# Patient Record
Sex: Female | Born: 1940
Health system: Southern US, Community
[De-identification: ages and names within clinical notes are randomized; demographics above are authoritative.]

## PROBLEM LIST (undated history)

## (undated) DIAGNOSIS — E7800 Pure hypercholesterolemia, unspecified: Secondary | ICD-10-CM

## (undated) DIAGNOSIS — I2510 Atherosclerotic heart disease of native coronary artery without angina pectoris: Secondary | ICD-10-CM

## (undated) DIAGNOSIS — I10 Essential (primary) hypertension: Secondary | ICD-10-CM

## (undated) DIAGNOSIS — G43909 Migraine, unspecified, not intractable, without status migrainosus: Secondary | ICD-10-CM

## (undated) DIAGNOSIS — J189 Pneumonia, unspecified organism: Secondary | ICD-10-CM

## (undated) DIAGNOSIS — M1990 Unspecified osteoarthritis, unspecified site: Secondary | ICD-10-CM

## (undated) HISTORY — PX: TUBAL LIGATION: SHX77

## (undated) HISTORY — PX: TONSILLECTOMY: SUR1361

---

## 1998-10-26 ENCOUNTER — Other Ambulatory Visit: Admission: RE | Admit: 1998-10-26 | Discharge: 1998-10-26 | Payer: Self-pay | Admitting: Obstetrics and Gynecology

## 2000-01-09 ENCOUNTER — Other Ambulatory Visit: Admission: RE | Admit: 2000-01-09 | Discharge: 2000-01-09 | Payer: Self-pay | Admitting: Obstetrics and Gynecology

## 2000-03-11 ENCOUNTER — Other Ambulatory Visit: Admission: RE | Admit: 2000-03-11 | Discharge: 2000-03-11 | Payer: Self-pay | Admitting: Obstetrics and Gynecology

## 2001-03-20 ENCOUNTER — Other Ambulatory Visit: Admission: RE | Admit: 2001-03-20 | Discharge: 2001-03-20 | Payer: Self-pay | Admitting: Obstetrics and Gynecology

## 2002-03-26 ENCOUNTER — Other Ambulatory Visit: Admission: RE | Admit: 2002-03-26 | Discharge: 2002-03-26 | Payer: Self-pay | Admitting: Obstetrics and Gynecology

## 2003-05-19 ENCOUNTER — Other Ambulatory Visit: Admission: RE | Admit: 2003-05-19 | Discharge: 2003-05-19 | Payer: Self-pay | Admitting: Obstetrics and Gynecology

## 2004-10-12 ENCOUNTER — Other Ambulatory Visit: Admission: RE | Admit: 2004-10-12 | Discharge: 2004-10-12 | Payer: Self-pay | Admitting: Obstetrics and Gynecology

## 2005-10-31 ENCOUNTER — Other Ambulatory Visit: Admission: RE | Admit: 2005-10-31 | Discharge: 2005-10-31 | Payer: Self-pay | Admitting: Obstetrics and Gynecology

## 2006-04-11 ENCOUNTER — Ambulatory Visit (HOSPITAL_COMMUNITY): Admission: RE | Admit: 2006-04-11 | Discharge: 2006-04-11 | Payer: Self-pay | Admitting: Internal Medicine

## 2010-10-11 ENCOUNTER — Other Ambulatory Visit (HOSPITAL_COMMUNITY): Payer: Self-pay | Admitting: Obstetrics and Gynecology

## 2010-10-11 DIAGNOSIS — Z139 Encounter for screening, unspecified: Secondary | ICD-10-CM

## 2010-10-13 ENCOUNTER — Ambulatory Visit (HOSPITAL_COMMUNITY)
Admission: RE | Admit: 2010-10-13 | Discharge: 2010-10-13 | Disposition: A | Discharge status: Home / Self Care | Payer: Medicare Other | Source: Ambulatory Visit | Attending: Obstetrics and Gynecology | Admitting: Obstetrics and Gynecology

## 2010-10-13 DIAGNOSIS — Z139 Encounter for screening, unspecified: Secondary | ICD-10-CM

## 2010-10-17 ENCOUNTER — Ambulatory Visit (HOSPITAL_COMMUNITY)
Admission: RE | Admit: 2010-10-17 | Discharge: 2010-10-17 | Disposition: A | Discharge status: Home / Self Care | Payer: Medicare Other | Source: Ambulatory Visit | Attending: Obstetrics and Gynecology | Admitting: Obstetrics and Gynecology

## 2010-10-17 DIAGNOSIS — Z1231 Encounter for screening mammogram for malignant neoplasm of breast: Secondary | ICD-10-CM | POA: Insufficient documentation

## 2011-09-18 ENCOUNTER — Telehealth: Payer: Self-pay

## 2011-09-18 NOTE — Telephone Encounter (Signed)
LMOM for pt to call. 

## 2011-09-20 ENCOUNTER — Other Ambulatory Visit: Payer: Self-pay

## 2011-09-20 DIAGNOSIS — Z139 Encounter for screening, unspecified: Secondary | ICD-10-CM

## 2011-09-20 NOTE — Telephone Encounter (Signed)
OK as is.

## 2011-09-20 NOTE — Telephone Encounter (Signed)
Per Selena Batten, had to change appt to 12:50 PM.  Entered on pt's instructions.  Rx and instructions faxed to [facility] pharmacy.

## 2011-09-20 NOTE — Telephone Encounter (Signed)
Gastroenterology Pre-Procedure Form   Request Date: 09/19/2011     Requesting Physician: Dr. Ouida Sills     PATIENT INFORMATION:  Anna Hicks is a 71 y.o., female (DOB=02-Jun-1941).  PROCEDURE: Procedure(s) requested: colonoscopy Procedure Reason: screening for colon cancer  PATIENT REVIEW QUESTIONS: The patient reports the following:   1. Diabetes Melitis: no 2. Joint replacements in the past 12 months: no 3. Major health problems in the past 3 months: no 4. Has an artificial valve or MVP:no 5. Has been advised in past to take antibiotics in advance of a procedure like teeth cleaning: no}    MEDICATIONS & ALLERGIES:    Patient reports the following regarding taking any blood thinners:   Plavix? no Aspirin?no Coumadin?  no  Patient confirms/reports the following medications:  Current Outpatient Prescriptions  Medication Sig Dispense Refill  . estrogen, conjugated,-medroxyprogesterone (PREMPRO) 0.625-5 MG per tablet Take 1 tablet by mouth daily. Pt takes 1/2 tablet daily      . Omega-3 Fatty Acids (FISH OIL) 1200 MG CAPS Take by mouth.        Patient confirms/reports the following allergies:  Allergies  Allergen Reactions  . Penicillins Hives    Patient is appropriate to schedule for requested procedure(s): yes  AUTHORIZATION INFORMATION Primary Insurance:   ID #:   Group #:  Pre-Cert / Auth required:  Pre-Cert / Auth #:   Secondary Insurance:   ID #:   Group #: Pre-Cert / Auth required Pre-Cert / Auth #:   No orders of the defined types were placed in this encounter.    SCHEDULE INFORMATION: Procedure has been scheduled as follows:  Date:09/25/2011        Time: 12:15 PM                              Location: [facility] Short Stay  This Gastroenterology Pre-Precedure Form is being routed to the following provider(s) for review: R. Roetta Sessions, MD

## 2011-09-24 MED ORDER — SODIUM CHLORIDE 0.45 % IV SOLN
Freq: Once | INTRAVENOUS | Status: AC
  Administered 2011-09-25: 12:00:00 via INTRAVENOUS

## 2011-09-25 ENCOUNTER — Other Ambulatory Visit: Payer: Self-pay | Admitting: Internal Medicine

## 2011-09-25 ENCOUNTER — Encounter (HOSPITAL_COMMUNITY): Payer: Self-pay

## 2011-09-25 ENCOUNTER — Encounter (HOSPITAL_COMMUNITY)
Admission: RE | Disposition: A | Discharge status: Home / Self Care | Payer: Self-pay | Source: Ambulatory Visit | Attending: Internal Medicine

## 2011-09-25 ENCOUNTER — Ambulatory Visit (HOSPITAL_COMMUNITY)
Admission: RE | Admit: 2011-09-25 | Discharge: 2011-09-25 | Disposition: A | Discharge status: Home / Self Care | Payer: Medicare Other | Source: Ambulatory Visit | Attending: Internal Medicine | Admitting: Internal Medicine

## 2011-09-25 DIAGNOSIS — D128 Benign neoplasm of rectum: Secondary | ICD-10-CM | POA: Insufficient documentation

## 2011-09-25 DIAGNOSIS — D129 Benign neoplasm of anus and anal canal: Secondary | ICD-10-CM | POA: Insufficient documentation

## 2011-09-25 DIAGNOSIS — Z139 Encounter for screening, unspecified: Secondary | ICD-10-CM

## 2011-09-25 DIAGNOSIS — K620 Anal polyp: Secondary | ICD-10-CM

## 2011-09-25 DIAGNOSIS — K5730 Diverticulosis of large intestine without perforation or abscess without bleeding: Secondary | ICD-10-CM | POA: Insufficient documentation

## 2011-09-25 DIAGNOSIS — Z1211 Encounter for screening for malignant neoplasm of colon: Secondary | ICD-10-CM | POA: Insufficient documentation

## 2011-09-25 DIAGNOSIS — K621 Rectal polyp: Secondary | ICD-10-CM

## 2011-09-25 HISTORY — PX: COLONOSCOPY: SHX5424

## 2011-09-25 HISTORY — DX: Unspecified osteoarthritis, unspecified site: M19.90

## 2011-09-25 SURGERY — COLONOSCOPY
Anesthesia: Moderate Sedation

## 2011-09-25 MED ORDER — MIDAZOLAM HCL 5 MG/5ML IJ SOLN
INTRAMUSCULAR | Status: AC
  Filled 2011-09-25: qty 10

## 2011-09-25 MED ORDER — MEPERIDINE HCL 100 MG/ML IJ SOLN
INTRAMUSCULAR | Status: AC
  Filled 2011-09-25: qty 2

## 2011-09-25 MED ORDER — MEPERIDINE HCL 100 MG/ML IJ SOLN
INTRAMUSCULAR | Status: DC | PRN
  Administered 2011-09-25: 50 mg via INTRAVENOUS
  Administered 2011-09-25: 25 mg via INTRAVENOUS

## 2011-09-25 MED ORDER — MIDAZOLAM HCL 5 MG/5ML IJ SOLN
INTRAMUSCULAR | Status: DC | PRN
  Administered 2011-09-25: 1 mg via INTRAVENOUS
  Administered 2011-09-25: 2 mg via INTRAVENOUS
  Administered 2011-09-25: 1 mg via INTRAVENOUS

## 2011-09-25 MED ORDER — SIMETHICONE 40 MG/0.6ML PO SUSP
ORAL | Status: DC | PRN
  Administered 2011-09-25: 13:00:00

## 2011-09-25 NOTE — H&P (Addendum)
  Primary Care Physician:  Carylon Perches, MD, MD Primary Gastroenterologist:  Dr.   Pre-Procedure History & Physical: HPI:  Anna Hicks is a 72 y.o. female is here for a screening colonoscopy. No prior colonoscopy. No bowel symptoms. No family history of colon polyps or colon cancer except in possibly a distant uncle and advanced age.  Past Medical History  Diagnosis Date  . Arthritis     History reviewed. No pertinent past surgical history.  Prior to Admission medications   Medication Sig Start Date End Date Taking? Authorizing Provider  estrogen, conjugated,-medroxyprogesterone (PREMPRO) 0.625-5 MG per tablet Take 1 tablet by mouth daily. Pt takes 1/2 tablet daily   Yes Historical Provider, MD  Omega-3 Fatty Acids (FISH OIL) 1200 MG CAPS Take by mouth.   Yes Historical Provider, MD    Allergies as of 09/20/2011 - Review Complete 09/20/2011  Allergen Reaction Noted  . Penicillins Hives 09/20/2011    Family History  Problem Relation Age of Onset  . Colon cancer Maternal Uncle     History   Social History  . Marital Status: Married    Spouse Name: N/A    Number of Children: N/A  . Years of Education: N/A   Occupational History  . Not on file.   Social History Main Topics  . Smoking status: Never Smoker   . Smokeless tobacco: Not on file  . Alcohol Use: No  . Drug Use: No  . Sexually Active:    Other Topics Concern  . Not on file   Social History Narrative  . No narrative on file    Review of Systems: See HPI, otherwise negative ROS  Physical Exam: BP 160/88  Pulse 70  Temp(Src) 98.4 F (36.9 C) (Oral)  Resp 22  Ht 5\' 2"  (1.575 m)  Wt 125 lb (56.7 kg)  BMI 22.86 kg/m2  SpO2 99% General:   Alert,  Well-developed, well-nourished, pleasant and cooperative in NAD Head:  Normocephalic and atraumatic. Eyes:  Sclera clear, no icterus.   Conjunctiva pink. Ears:  Normal auditory acuity. Nose:  No deformity, discharge,  or lesions. Mouth:  No deformity or  lesions, dentition normal. Neck:  Supple; no masses or thyromegaly. Lungs:  Clear throughout to auscultation.   No wheezes, crackles, or rhonchi. No acute distress. Heart:  Regular rate and rhythm; no murmurs, clicks, rubs,  or gallops. Abdomen: Flat. Positive bowel sounds soft nontender without appreciable mass organomegaly Msk:  Symmetrical without gross deformities. Normal posture. Pulses:  Normal pulses noted. Extremities:  Without clubbing or edema. Neurologic:  Alert and  oriented x4;  grossly normal neurologically. Skin:  Intact without significant lesions or rashes. Cervical Nodes:  No significant cervical adenopathy. Psych:  Alert and cooperative. Normal mood and affect.  Impression/Plan: Anna Hicks is now here to undergo a screening colonoscopy.  First ever average risk screening examination.  Risks, benefits, limitations, imponderables and alternatives regarding colonoscopy have been reviewed with the patient. Questions have been answered. All parties agreeable.

## 2011-09-25 NOTE — Op Note (Signed)
[facility] [address] [place], [place]  [address]  COLONOSCOPY PROCEDURE REPORT  PATIENT:  Anna Hicks, Anna Hicks  MR#:  [id number] BIRTHDATE:  1941/03/28, 70 yrs. old  GENDER:  female ENDOSCOPIST:  R. Roetta Sessions, MD FACP [facility] REF. BY:          Dr. Carylon Perches PROCEDURE DATE:  09/25/2011 PROCEDURE:  Colonoscopy with biopsy  INDICATIONS:  First ever average risk screening colonoscopy  INFORMED CONSENT:  The risks, benefits, alternatives and imponderables including but not limited to bleeding, perforation as well as the possibility of a missed lesion have been reviewed. The potential for biopsy, lesion removal, etc. have also been discussed.  Questions have been answered.  All parties agreeable. Please see the history and physical in the medical record for more information.  MEDICATIONS:  Versed 4 mg and Demerol 75 mg IV in divided doses  DESCRIPTION OF PROCEDURE:  After a digital rectal exam was performed, the EC-[id number] ([id number]) colonoscope was advanced from the anus through the rectum and colon to the area of the cecum, ileocecal valve and appendiceal orifice.  The cecum was deeply intubated.  These structures were well-seen and photographed for the record.  From the level of the cecum and ileocecal valve, the scope was slowly and cautiously withdrawn.  The mucosal surfaces were carefully surveyed utilizing scope tip deflection to facilitate fold flattening as needed.  The scope was pulled down into the rectum where a thorough examination including retroflexion was performed. <<PROCEDUREIMAGES>>  FINDINGS:   Excellent preparation. 4 diminutive polyps in at 5 cm from the anal verge; otherwise normal rectum. Left-sided diverticula; remainder of colonic mucosa appeared normal  THERAPEUTIC / DIAGNOSTIC MANEUVERS PERFORMED:   4 diminutive polyps in the rectum removed with cold biopsy technique  COMPLICATIONS:  none  CECAL WITHDRAWAL TIME:   11 minutes  IMPRESSION:      Rectal polyps-removed as described above; left-sided colonic diverticulosis  RECOMMENDATIONS:    Followup pathology  ______________________________ R. Roetta Sessions, MD Caleen Essex  CC:  n. eSIGNED:   R. Roetta Sessions at 09/25/2011 01:45 PM  Lyndel Pleasure, [phone]

## 2011-09-29 ENCOUNTER — Encounter: Payer: Self-pay | Admitting: Internal Medicine

## 2011-10-03 ENCOUNTER — Encounter (HOSPITAL_COMMUNITY): Payer: Self-pay | Admitting: Internal Medicine

## 2012-04-28 ENCOUNTER — Encounter: Payer: Self-pay | Admitting: Obstetrics and Gynecology

## 2012-04-28 ENCOUNTER — Ambulatory Visit (INDEPENDENT_AMBULATORY_CARE_PROVIDER_SITE_OTHER): Payer: Medicare Other | Admitting: Obstetrics and Gynecology

## 2012-04-28 VITALS — BP 130/88 | HR 64 | Ht 60.5 in | Wt 132.0 lb

## 2012-04-28 DIAGNOSIS — Z01419 Encounter for gynecological examination (general) (routine) without abnormal findings: Secondary | ICD-10-CM

## 2012-04-28 DIAGNOSIS — Z124 Encounter for screening for malignant neoplasm of cervix: Secondary | ICD-10-CM

## 2012-04-28 MED ORDER — CONJ ESTROG-MEDROXYPROGEST ACE 0.625-5 MG PO TABS: 1.0000 | ORAL_TABLET | Freq: Every day | ORAL | Status: DC

## 2012-04-28 NOTE — Progress Notes (Signed)
The patient is taking hormone replacement therapy x 16 years The patient  is not taking a Calcium supplement. Post-menopausal bleeding:no  Last Pap: was normal February  2012 Last mammogram: was normal February  2012 Last DEXA scan : T= -1.09 October 2009 Last colonoscopy:abnormal  3 polyps April 2013  Urinary symptoms: none Normal bowel movements: Yes Reports abuse at home: No:   Subjective:    Anna Hicks is a 71 y.o. female G2P2 who presents for annual exam.  The patient has no complaints today.   The following portions of the patient's history were reviewed and updated as appropriate: allergies, current medications, past family history, past medical history, past social history, past surgical history and problem list.  Review of Systems Pertinent items are noted in HPI. Gastrointestinal:No change in bowel habits, no abdominal pain, no rectal bleeding Genitourinary:negative for dysuria, frequency, hematuria, nocturia and urinary incontinence    Objective:     BP 130/88  Pulse 64  Ht 5' 0.5" (1.537 m)  Wt 132 lb (59.875 kg)  BMI 25.36 kg/m2  Weight:  Wt Readings from Last 1 Encounters:  04/28/12 132 lb (59.875 kg)     BMI: Body mass index is 25.36 kg/(m^2). General Appearance: Alert, appropriate appearance for age. No acute distress HEENT: Grossly normal Neck / Thyroid: Supple, no masses, nodes or enlargement Lungs: clear to auscultation bilaterally Back: No CVA tenderness Breast Exam: No dimpling, nipple retraction or discharge. No masses or nodes. and No masses or nodes.No dimpling, nipple retraction or discharge. Cardiovascular: Regular rate and rhythm. S1, S2, no murmur Gastrointestinal: Soft, non-tender, no masses or organomegaly.colonoscopy done.  Polyps removed. Pelvic Exam: Vulva and vagina appear normal. Bimanual exam reveals normal uterus and adnexa. Rectovaginal: normal rectal, no masses Lymphatic Exam: Non-palpable nodes in neck, clavicular, axillary,  or inguinal regions Skin: no rash or abnormalities Neurologic: Normal gait and speech, no tremor  Psychiatric: Alert and oriented, appropriate affect.    Urinalysis:Not done    Assessment:    Normal gyn exam    Plan:   Mammogram yearly recommended: pt to schedule in [place] pap smear return annually or prn Follow-up:  for annual exam Discussed BRCA-1 counseling due to daughter being carrier with b/l mastectomy. Pt may make appointment at a later date. HRT reviewed with [facility], [facility]: pt desires to continue

## 2012-04-29 LAB — PAP IG W/ RFLX HPV ASCU

## 2012-04-30 ENCOUNTER — Telehealth: Payer: Self-pay

## 2012-04-30 NOTE — Telephone Encounter (Signed)
[facility] pharmacy calling for confirmation of Prempro.  Pt has been on 0.625/2.5 - take one tab daily.  rx needs to stay the same in order for it not to be recoded and insurance possible not cover.  rx  Changed back to 0.625/2.5 for these purposes.  Spoke with Elita Quick at [facility] [facility].  ld

## 2012-05-01 ENCOUNTER — Ambulatory Visit: Payer: Self-pay | Admitting: Obstetrics and Gynecology

## 2013-09-21 ENCOUNTER — Other Ambulatory Visit (HOSPITAL_COMMUNITY): Payer: Self-pay | Admitting: Internal Medicine

## 2013-09-21 DIAGNOSIS — Z139 Encounter for screening, unspecified: Secondary | ICD-10-CM

## 2013-10-09 ENCOUNTER — Ambulatory Visit (HOSPITAL_COMMUNITY)
Admission: RE | Admit: 2013-10-09 | Discharge: 2013-10-09 | Disposition: A | Discharge status: Home / Self Care | Payer: Medicare Other | Source: Ambulatory Visit | Attending: Internal Medicine | Admitting: Internal Medicine

## 2013-10-09 ENCOUNTER — Other Ambulatory Visit (HOSPITAL_COMMUNITY): Payer: Self-pay | Admitting: Internal Medicine

## 2013-10-09 DIAGNOSIS — Z1231 Encounter for screening mammogram for malignant neoplasm of breast: Secondary | ICD-10-CM | POA: Insufficient documentation

## 2013-10-09 DIAGNOSIS — Z139 Encounter for screening, unspecified: Secondary | ICD-10-CM

## 2013-10-09 DIAGNOSIS — Z1239 Encounter for other screening for malignant neoplasm of breast: Secondary | ICD-10-CM

## 2014-07-05 ENCOUNTER — Encounter: Payer: Self-pay | Admitting: Obstetrics and Gynecology

## 2014-10-12 ENCOUNTER — Encounter (HOSPITAL_COMMUNITY): Payer: Self-pay

## 2014-10-12 ENCOUNTER — Ambulatory Visit (HOSPITAL_COMMUNITY)
Admission: RE | Admit: 2014-10-12 | Discharge: 2014-10-12 | Disposition: A | Discharge status: Home / Self Care | Payer: Medicare Other | Source: Ambulatory Visit | Attending: Internal Medicine | Admitting: Internal Medicine

## 2014-10-12 ENCOUNTER — Encounter (HOSPITAL_COMMUNITY)
Admission: RE | Admit: 2014-10-12 | Discharge: 2014-10-12 | Disposition: A | Discharge status: Home / Self Care | Payer: Medicare Other | Source: Ambulatory Visit | Attending: Internal Medicine | Admitting: Internal Medicine

## 2014-10-12 DIAGNOSIS — R0602 Shortness of breath: Secondary | ICD-10-CM

## 2014-10-12 DIAGNOSIS — R55 Syncope and collapse: Secondary | ICD-10-CM | POA: Diagnosis not present

## 2014-10-12 DIAGNOSIS — R0600 Dyspnea, unspecified: Secondary | ICD-10-CM

## 2014-10-12 MED ORDER — SODIUM CHLORIDE 0.9 % IJ SOLN
INTRAMUSCULAR | Status: AC
  Filled 2014-10-12: qty 3

## 2014-10-12 MED ORDER — TECHNETIUM TC 99M SESTAMIBI GENERIC - CARDIOLITE
30.0000 | Freq: Once | INTRAVENOUS | Status: AC | PRN
  Administered 2014-10-12: 30 via INTRAVENOUS

## 2014-10-12 MED ORDER — SODIUM CHLORIDE 0.9 % IJ SOLN
10.0000 mL | INTRAMUSCULAR | Status: DC | PRN
  Administered 2014-10-12: 10 mL via INTRAVENOUS
  Filled 2014-10-12: qty 10

## 2014-10-12 NOTE — Progress Notes (Signed)
Stress Lab Nurses Notes - [place] [facility] [place] 10/12/2014 Reason for doing test: Dyspnea and Syncope Type of test: Stress Cardiolite / 2 Day Study Nurse performing test: Gerrit Halls, RN Nuclear Medicine Tech: Melburn Hake Echo Tech: Not Applicable MD performing test: R. Willey Blade Family MD: Willey Blade Test explained and consent signed: Yes.   IV started: Saline lock flushed and No redness or edema Symptoms:SOB  Treatment/Intervention: None Reason test stopped: fatigue and reached target HR After recovery IV was: Discontinued via X-ray tech and No redness or edema Patient to return to [facility] Med at : 8:15 Patient discharged: Home Patient's Condition upon discharge was: stable Comments: During test peak BP 215/117 & HR 166.  Recovery BP 180/91 & HR 83.  Symptoms resolved in recovery. Geanie Cooley T

## 2014-10-13 ENCOUNTER — Encounter (HOSPITAL_COMMUNITY): Payer: Self-pay

## 2014-10-13 ENCOUNTER — Encounter (HOSPITAL_COMMUNITY)
Admission: RE | Admit: 2014-10-13 | Discharge: 2014-10-13 | Disposition: A | Discharge status: Home / Self Care | Payer: Medicare Other | Source: Ambulatory Visit | Attending: Internal Medicine | Admitting: Internal Medicine

## 2014-10-13 DIAGNOSIS — R55 Syncope and collapse: Secondary | ICD-10-CM | POA: Diagnosis not present

## 2014-10-13 MED ORDER — TECHNETIUM TC 99M SESTAMIBI - CARDIOLITE
25.0000 | Freq: Once | INTRAVENOUS | Status: AC | PRN
  Administered 2014-10-13: 07:00:00 23 via INTRAVENOUS

## 2014-10-15 ENCOUNTER — Ambulatory Visit (INDEPENDENT_AMBULATORY_CARE_PROVIDER_SITE_OTHER): Payer: Medicare Other | Admitting: Cardiovascular Disease

## 2014-10-15 ENCOUNTER — Other Ambulatory Visit: Payer: Self-pay | Admitting: Cardiovascular Disease

## 2014-10-15 ENCOUNTER — Ambulatory Visit (HOSPITAL_COMMUNITY)
Admission: RE | Admit: 2014-10-15 | Discharge: 2014-10-15 | Disposition: A | Discharge status: Home / Self Care | Payer: Medicare Other | Source: Ambulatory Visit | Attending: Cardiovascular Disease | Admitting: Cardiovascular Disease

## 2014-10-15 ENCOUNTER — Encounter: Payer: Self-pay | Admitting: Cardiovascular Disease

## 2014-10-15 VITALS — BP 159/85 | HR 65 | Ht 61.0 in | Wt 135.0 lb

## 2014-10-15 DIAGNOSIS — R0609 Other forms of dyspnea: Secondary | ICD-10-CM

## 2014-10-15 DIAGNOSIS — E785 Hyperlipidemia, unspecified: Secondary | ICD-10-CM

## 2014-10-15 DIAGNOSIS — R931 Abnormal findings on diagnostic imaging of heart and coronary circulation: Secondary | ICD-10-CM

## 2014-10-15 DIAGNOSIS — R0602 Shortness of breath: Secondary | ICD-10-CM | POA: Diagnosis not present

## 2014-10-15 DIAGNOSIS — R9439 Abnormal result of other cardiovascular function study: Secondary | ICD-10-CM

## 2014-10-15 DIAGNOSIS — Z01811 Encounter for preprocedural respiratory examination: Secondary | ICD-10-CM

## 2014-10-15 DIAGNOSIS — Z136 Encounter for screening for cardiovascular disorders: Secondary | ICD-10-CM

## 2014-10-15 DIAGNOSIS — R55 Syncope and collapse: Secondary | ICD-10-CM

## 2014-10-15 DIAGNOSIS — Z789 Other specified health status: Secondary | ICD-10-CM

## 2014-10-15 DIAGNOSIS — I10 Essential (primary) hypertension: Secondary | ICD-10-CM

## 2014-10-15 DIAGNOSIS — Z889 Allergy status to unspecified drugs, medicaments and biological substances status: Secondary | ICD-10-CM

## 2014-10-15 MED ORDER — EZETIMIBE 10 MG PO TABS: 10.0000 mg | ORAL_TABLET | Freq: Every day | ORAL | Status: AC

## 2014-10-15 MED ORDER — LOSARTAN POTASSIUM 25 MG PO TABS: 25.0000 mg | ORAL_TABLET | Freq: Every day | ORAL | Status: DC

## 2014-10-15 NOTE — Patient Instructions (Addendum)
Your physician recommends that you schedule a follow-up appointment in: 2 weeks with Dr Bronson Ing    START Losartan 25 mg daily for your blood pressure  Your physician has requested that you have an echocardiogram. Echocardiography is a painless test that uses sound waves to create images of your heart. It provides your doctor with information about the size and shape of your heart and how well your heart's chambers and valves are working. This procedure takes approximately one hour. There are no restrictions for this procedure.  PLEASE GET CHEST X-RAY TODAY  Your physician has requested that you have a cardiac catheterization on 10/20/14 at [facility] with Dr.McAlhany,arrive at [place]. Cardiac catheterization is used to diagnose and/or treat various heart conditions. Doctors may recommend this procedure for a number of different reasons. The most common reason is to evaluate chest pain. Chest pain can be a symptom of coronary artery disease (CAD), and cardiac catheterization can show whether plaque is narrowing or blocking your heart's arteries. This procedure is also used to evaluate the valves, as well as measure the blood flow and oxygen levels in different parts of your heart. For further information please visit [email or web address]. Please follow instruction sheet, as given.     Thank you for choosing [facility] !         Addendum:Add Zetia 10 mg daily,mailed lab slip for lipid recheck in 3 months,pt aware

## 2014-10-15 NOTE — Progress Notes (Addendum)
Patient ID: TAMERA PINGLEY, female   DOB: 03-21-1941, 74 y.o.   MRN: [id number]       CARDIOLOGY CONSULT NOTE  Patient ID: MARAYA GWILLIAM MRN: [id number] DOB/AGE: 10/09/1940 74 y.o.  Admit date: (Not on file) Primary Physician Asencion Noble, MD  Reason for Consultation: abnormal stress test  HPI: The patient is a 74 year old woman with a history of hyperlipidemia intolerant to statin therapy. She had been experiencing shortness of breath and saw her primary care physician, Dr. Willey Blade. He ordered a nuclear stress test which demonstrated a reversible anterior wall defect suggestive of ischemia, calculated LVEF 79%. Normal regional wall motion was demonstrated. ECG today shows normal sinus rhythm, heart rate 61 bpm, with no ischemic ST segment or T-wave abnormalities. She is here with her son, Jenny Reichmann, who teaches physical education and health at [employer]. She has been experiencing exertional dyspnea for the past 6 months. 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 She denies chest pain, orthopnea, paroxysmal nocturnal dyspnea, and leg swelling. She had been snow skiing approximately 2 weeks ago and bent over and stood up and passed out for roughly 4 minutes. She also noticed that her hands turned purple the other day while shopping. She denies pain in her extremities. She has a wrist automated blood pressure cuff at home and checks it regularly, and systolic readings have consistently been in the 140-150 range.    Allergies  Allergen Reactions  . Penicillins Hives    Current Outpatient Prescriptions  Medication Sig Dispense Refill  . aspirin 81 MG tablet Take 81 mg by mouth daily.    Marland Kitchen estrogen, conjugated,-medroxyprogesterone (PREMPRO) 0.625-5 MG per tablet Take 1 tablet by mouth daily. Pt takes 1/2 tablet daily 30 tablet 11   No current facility-administered medications for this visit.    Past Medical History  Diagnosis  Date  . Arthritis     Past Surgical History  Procedure Laterality Date  . Colonoscopy  09/25/2011    Procedure: COLONOSCOPY;  Surgeon: Daneil Dolin, MD;  Location: [facility];  Service: Endoscopy;  Laterality: N/A;  12:15 pm    History   Social History  . Marital Status: Married    Spouse Name: N/A  . Number of Children: N/A  . Years of Education: N/A   Occupational History  . Not on file.   Social History Main Topics  . Smoking status: Never Smoker   . Smokeless tobacco: Never Used  . Alcohol Use: No  . Drug Use: No  . Sexual Activity:    Partners: Male    Birth Control/ Protection: Post-menopausal   Other Topics Concern  . Not on file   Social History Narrative     No family history of premature CAD in 1st degree relatives.  Prior to Admission medications   Medication Sig Start Date End Date Taking? Authorizing Provider  aspirin 81 MG tablet Take 81 mg by mouth daily.   Yes Historical Provider, MD  estrogen, conjugated,-medroxyprogesterone (PREMPRO) 0.625-5 MG per tablet Take 1 tablet by mouth daily. Pt takes 1/2 tablet daily 04/28/12  Yes Delsa Bern, MD     Review of systems complete and found to be negative unless listed above in HPI     Physical exam Blood pressure 159/85, pulse 65, height 5\' 1"  (1.549 m), weight 135 lb (61.236 kg), SpO2 96 %. General: NAD Neck: No JVD, no thyromegaly or thyroid nodule.  Lungs:  Clear to auscultation bilaterally with normal respiratory effort. CV: Nondisplaced PMI. Regular rate and rhythm, normal S1/S2, no S3/S4, no murmur.  No peripheral edema.  No carotid bruit.  Normal pedal pulses.  Abdomen: Soft, nontender, no hepatosplenomegaly, no distention.  Skin: Intact without lesions or rashes.  Neurologic: Alert and oriented x 3.  Psych: Normal affect. Extremities: No clubbing or cyanosis.  HEENT: Normal.   ECG: Most recent ECG reviewed.  Labs:  No results found for: WBC, HGB, HCT, MCV, PLT No results for  input(s): NA, K, CL, CO2, BUN, CREATININE, CALCIUM, PROT, BILITOT, ALKPHOS, ALT, AST, GLUCOSE in the last 168 hours.  Invalid input(s): LABALBU No results found for: CKTOTAL, CKMB, CKMBINDEX, TROPONINI No results found for: CHOL No results found for: HDL No results found for: LDLCALC No results found for: TRIG No results found for: CHOLHDL No results found for: LDLDIRECT       Studies: No results found.  ASSESSMENT AND PLAN:  1. Exertional dyspnea and syncope with abnormal nuclear stress test: There was no mention of anterior wall soft tissue attenuation artifact although regional wall motion was noted to be normal with normal LV systolic function. Her only complaint is shortness of breath. It appears she has untreated essential hypertension. I will obtain an echocardiogram to evaluate left ventricular systolic and diastolic function. In order to achieve diagnostic certainty, I will arrange for coronary angiography.  2. Essential hypertension: It appears she has had consistent systolic readings ranging from 140-150. I will start losartan 25 mg daily.  I will obtain an echocardiogram to evaluate left ventricular systolic and diastolic function and to evaluate for LVH and chamber size to assess chronicity of hypertension.  3. Hyperlipidemia: Appears to be intolerant to multiple statins. I will obtain a copy of lipids from PCP's office. I would consider initiating Zetia.  Dispo: f/u after cath.  Signed: Kate Sable, M.D., F.A.C.C.  10/15/2014, 9:41 AM  Labs reviewed. Normal renal function. Markedly elevated lipids (total cholesterol, triglycerides, and LDL). Will start Zetia 10 mg daily.

## 2014-10-15 NOTE — Addendum Note (Signed)
Addended by: Barbarann Ehlers A on: 10/15/2014 12:34 PM   Modules accepted: Orders

## 2014-10-18 ENCOUNTER — Other Ambulatory Visit (HOSPITAL_COMMUNITY): Payer: Self-pay | Admitting: Cardiovascular Disease

## 2014-10-18 ENCOUNTER — Ambulatory Visit (HOSPITAL_COMMUNITY)
Admission: RE | Admit: 2014-10-18 | Discharge: 2014-10-18 | Disposition: A | Discharge status: Home / Self Care | Payer: Medicare Other | Source: Ambulatory Visit | Attending: Cardiovascular Disease | Admitting: Cardiovascular Disease

## 2014-10-18 ENCOUNTER — Other Ambulatory Visit: Payer: Self-pay

## 2014-10-18 DIAGNOSIS — R0600 Dyspnea, unspecified: Secondary | ICD-10-CM | POA: Insufficient documentation

## 2014-10-18 DIAGNOSIS — I10 Essential (primary) hypertension: Secondary | ICD-10-CM

## 2014-10-18 DIAGNOSIS — R0602 Shortness of breath: Secondary | ICD-10-CM

## 2014-10-18 DIAGNOSIS — E785 Hyperlipidemia, unspecified: Secondary | ICD-10-CM | POA: Insufficient documentation

## 2014-10-18 NOTE — Progress Notes (Signed)
*  PRELIMINARY RESULTS* Echocardiogram 2D Echocardiogram has been performed.  Leavy Cella 10/18/2014, 12:17 PM

## 2014-10-19 ENCOUNTER — Encounter: Payer: Self-pay | Admitting: Cardiovascular Disease

## 2014-10-20 ENCOUNTER — Ambulatory Visit (HOSPITAL_COMMUNITY)
Admission: RE | Admit: 2014-10-20 | Discharge: 2014-10-21 | Disposition: A | Discharge status: Home / Self Care | Payer: Medicare Other | Source: Ambulatory Visit | Attending: Cardiovascular Disease | Admitting: Cardiovascular Disease

## 2014-10-20 ENCOUNTER — Encounter (HOSPITAL_COMMUNITY)
Admission: RE | Disposition: A | Discharge status: Home / Self Care | Payer: Medicare Other | Source: Ambulatory Visit | Attending: Cardiovascular Disease

## 2014-10-20 ENCOUNTER — Ambulatory Visit (HOSPITAL_COMMUNITY): Payer: Medicare Other

## 2014-10-20 ENCOUNTER — Encounter (HOSPITAL_COMMUNITY): Payer: Self-pay | Admitting: General Practice

## 2014-10-20 DIAGNOSIS — I10 Essential (primary) hypertension: Secondary | ICD-10-CM | POA: Insufficient documentation

## 2014-10-20 DIAGNOSIS — E785 Hyperlipidemia, unspecified: Secondary | ICD-10-CM | POA: Insufficient documentation

## 2014-10-20 DIAGNOSIS — I2510 Atherosclerotic heart disease of native coronary artery without angina pectoris: Secondary | ICD-10-CM | POA: Insufficient documentation

## 2014-10-20 DIAGNOSIS — R4701 Aphasia: Secondary | ICD-10-CM | POA: Diagnosis not present

## 2014-10-20 DIAGNOSIS — R931 Abnormal findings on diagnostic imaging of heart and coronary circulation: Secondary | ICD-10-CM

## 2014-10-20 DIAGNOSIS — R0600 Dyspnea, unspecified: Secondary | ICD-10-CM | POA: Diagnosis not present

## 2014-10-20 DIAGNOSIS — Z7982 Long term (current) use of aspirin: Secondary | ICD-10-CM | POA: Insufficient documentation

## 2014-10-20 DIAGNOSIS — I639 Cerebral infarction, unspecified: Secondary | ICD-10-CM | POA: Insufficient documentation

## 2014-10-20 DIAGNOSIS — Z880 Allergy status to penicillin: Secondary | ICD-10-CM | POA: Diagnosis not present

## 2014-10-20 DIAGNOSIS — R0609 Other forms of dyspnea: Secondary | ICD-10-CM

## 2014-10-20 DIAGNOSIS — R55 Syncope and collapse: Secondary | ICD-10-CM

## 2014-10-20 DIAGNOSIS — I2583 Coronary atherosclerosis due to lipid rich plaque: Secondary | ICD-10-CM

## 2014-10-20 DIAGNOSIS — R9430 Abnormal result of cardiovascular function study, unspecified: Secondary | ICD-10-CM | POA: Diagnosis present

## 2014-10-20 DIAGNOSIS — I471 Supraventricular tachycardia: Secondary | ICD-10-CM | POA: Diagnosis not present

## 2014-10-20 DIAGNOSIS — I6330 Cerebral infarction due to thrombosis of unspecified cerebral artery: Secondary | ICD-10-CM

## 2014-10-20 HISTORY — DX: Migraine, unspecified, not intractable, without status migrainosus: G43.909

## 2014-10-20 HISTORY — DX: Pneumonia, unspecified organism: J18.9

## 2014-10-20 HISTORY — DX: Essential (primary) hypertension: I10

## 2014-10-20 HISTORY — DX: Pure hypercholesterolemia, unspecified: E78.00

## 2014-10-20 HISTORY — PX: LEFT HEART CATHETERIZATION WITH CORONARY ANGIOGRAM: SHX5451

## 2014-10-20 HISTORY — PX: CARDIAC CATHETERIZATION: SHX172

## 2014-10-20 HISTORY — DX: Atherosclerotic heart disease of native coronary artery without angina pectoris: I25.10

## 2014-10-20 LAB — CBC
HCT: 42.7 % (ref 36.0–46.0)
Hemoglobin: 14.5 g/dL (ref 12.0–15.0)
MCH: 29.2 pg (ref 26.0–34.0)
MCHC: 34 g/dL (ref 30.0–36.0)
MCV: 85.9 fL (ref 78.0–100.0)
Platelets: 246 10*3/uL (ref 150–400)
RBC: 4.97 MIL/uL (ref 3.87–5.11)
RDW: 13.1 % (ref 11.5–15.5)
WBC: 5.7 10*3/uL (ref 4.0–10.5)

## 2014-10-20 LAB — BASIC METABOLIC PANEL
Anion gap: 5 (ref 5–15)
BUN: 18 mg/dL (ref 6–23)
CALCIUM: 9.3 mg/dL (ref 8.4–10.5)
CHLORIDE: 105 mmol/L (ref 96–112)
CO2: 30 mmol/L (ref 19–32)
Creatinine, Ser: 1.08 mg/dL (ref 0.50–1.10)
GFR calc Af Amer: 58 mL/min — ABNORMAL LOW (ref >90)
GFR, EST NON AFRICAN AMERICAN: 50 mL/min — AB (ref >90)
Glucose, Bld: 99 mg/dL (ref 70–99)
Potassium: 4 mmol/L (ref 3.5–5.1)
Sodium: 140 mmol/L (ref 135–145)

## 2014-10-20 LAB — CK TOTAL AND CKMB (NOT AT ARMC)
CK, MB: 12.5 ng/mL (ref 0.3–4.0)
Relative Index: 11.4 — ABNORMAL HIGH (ref 0.0–2.5)
Total CK: 110 U/L (ref 7–177)

## 2014-10-20 LAB — PROTIME-INR
INR: 0.95 (ref 0.00–1.49)
Prothrombin Time: 12.8 seconds (ref 11.6–15.2)

## 2014-10-20 LAB — GLUCOSE, CAPILLARY: GLUCOSE-CAPILLARY: 111 mg/dL — AB (ref 70–99)

## 2014-10-20 LAB — POCT ACTIVATED CLOTTING TIME: Activated Clotting Time: 374 seconds

## 2014-10-20 SURGERY — LEFT HEART CATHETERIZATION WITH CORONARY ANGIOGRAM
Anesthesia: LOCAL

## 2014-10-20 MED ORDER — HYDRALAZINE HCL 20 MG/ML IJ SOLN
10.0000 mg | Freq: Once | INTRAMUSCULAR | Status: AC
  Administered 2014-10-20: 10 mg via INTRAVENOUS

## 2014-10-20 MED ORDER — HEPARIN (PORCINE) IN NACL 2-0.9 UNIT/ML-% IJ SOLN
INTRAMUSCULAR | Status: AC
  Filled 2014-10-20: qty 1000

## 2014-10-20 MED ORDER — LORAZEPAM 2 MG/ML IJ SOLN
INTRAMUSCULAR | Status: AC
  Filled 2014-10-20: qty 1

## 2014-10-20 MED ORDER — HYDRALAZINE HCL 20 MG/ML IJ SOLN
INTRAMUSCULAR | Status: AC
  Filled 2014-10-20: qty 1

## 2014-10-20 MED ORDER — SODIUM CHLORIDE 0.9 % IJ SOLN: 3.0000 mL | Freq: Two times a day (BID) | INTRAMUSCULAR | Status: DC

## 2014-10-20 MED ORDER — ADENOSINE 12 MG/4ML IV SOLN
12.0000 mL | Freq: Once | INTRAVENOUS | Status: DC
  Filled 2014-10-20: qty 12

## 2014-10-20 MED ORDER — BIVALIRUDIN 250 MG IV SOLR
INTRAVENOUS | Status: AC
  Filled 2014-10-20: qty 250

## 2014-10-20 MED ORDER — VERAPAMIL HCL 2.5 MG/ML IV SOLN
INTRAVENOUS | Status: AC
  Filled 2014-10-20: qty 2

## 2014-10-20 MED ORDER — SODIUM CHLORIDE 0.9 % IV SOLN: INTRAVENOUS | Status: AC

## 2014-10-20 MED ORDER — MIDAZOLAM HCL 2 MG/2ML IJ SOLN
INTRAMUSCULAR | Status: AC
  Filled 2014-10-20: qty 2

## 2014-10-20 MED ORDER — ACETAMINOPHEN 325 MG PO TABS: 650.0000 mg | ORAL_TABLET | ORAL | Status: DC | PRN

## 2014-10-20 MED ORDER — ASPIRIN 81 MG PO CHEW: 81.0000 mg | CHEWABLE_TABLET | ORAL | Status: DC

## 2014-10-20 MED ORDER — LORAZEPAM 2 MG/ML IJ SOLN
1.0000 mg | Freq: Once | INTRAMUSCULAR | Status: AC
  Administered 2014-10-20: 1 mg via INTRAVENOUS

## 2014-10-20 MED ORDER — ASPIRIN EC 81 MG PO TBEC
81.0000 mg | DELAYED_RELEASE_TABLET | Freq: Every day | ORAL | Status: DC
  Administered 2014-10-21: 81 mg via ORAL
  Filled 2014-10-20: qty 1

## 2014-10-20 MED ORDER — LORAZEPAM 2 MG/ML IJ SOLN
1.0000 mg | Freq: Once | INTRAMUSCULAR | Status: AC
Start: 2014-10-20 — End: 2014-10-20
  Administered 2014-10-20: 1 mg via INTRAVENOUS

## 2014-10-20 MED ORDER — FENTANYL CITRATE 0.05 MG/ML IJ SOLN
INTRAMUSCULAR | Status: AC
  Filled 2014-10-20: qty 2

## 2014-10-20 MED ORDER — SODIUM CHLORIDE 0.9 % IJ SOLN
3.0000 mL | INTRAMUSCULAR | Status: DC | PRN
Start: 2014-10-20 — End: 2014-10-20

## 2014-10-20 MED ORDER — SODIUM CHLORIDE 0.9 % IV SOLN
INTRAVENOUS | Status: DC
  Administered 2014-10-20: 08:00:00 via INTRAVENOUS

## 2014-10-20 MED ORDER — SODIUM CHLORIDE 0.9 % IV SOLN: 250.0000 mL | INTRAVENOUS | Status: DC | PRN

## 2014-10-20 MED ORDER — ATROPINE SULFATE 0.1 MG/ML IJ SOLN
INTRAMUSCULAR | Status: AC
  Filled 2014-10-20: qty 10

## 2014-10-20 MED ORDER — ONDANSETRON HCL 4 MG/2ML IJ SOLN: 4.0000 mg | Freq: Four times a day (QID) | INTRAMUSCULAR | Status: DC | PRN

## 2014-10-20 MED ORDER — LIDOCAINE HCL (PF) 1 % IJ SOLN
INTRAMUSCULAR | Status: AC
  Filled 2014-10-20: qty 30

## 2014-10-20 NOTE — Progress Notes (Signed)
Returned from [place]. Code Stroke RN at bedside.

## 2014-10-20 NOTE — Progress Notes (Signed)
Pt with expressive aphasia 2 hours following the diagnostic cardiac cath. Moving all extremities. Code Stroke called. Head CT and brain MRI with no evidence of CVA. Appreciate neurology assistance. Symptoms resolved completely by 7pm. Possible complex migraine. Monitor overnight.   Anna Hicks 10/20/2014 9:00 PM

## 2014-10-20 NOTE — Consult Note (Signed)
Admission H&P    Chief Complaint: Acute onset of speech output difficulty.  HPI: Anna Hicks is an 74 y.o. female  with a history of arthritis and recently started on medication for hypertension and hyperlipidemia, who developed speech output difficulty following cardiac catheterization day. She was last known well at 2:35 PM. She has no previous history of stroke nor TIA. She had no difficulty understanding what was being said to her and following simple commands. Speech output was nonsensical, however. She's been taking aspirin for platelet therapy. CT scan of her head showed no acute intracranial abnormality. Small area of Motrin infarction was noted involving the left caudate nucleus. NIH stroke score was 2. Patient was not a candidate for TPA because she had received Angiomax with cardiac catheterization procedure. She had no focal or sensory changes. She initially complained of visual changes and subsequently resolved.  LSN: 2:35 PM on 10/20/2014 tPA Given: No: Was given Angiomax with cardiac catheterization procedure mRankin:  Past Medical History  Diagnosis Date  . Arthritis     Past Surgical History  Procedure Laterality Date  . Colonoscopy  09/25/2011    Procedure: COLONOSCOPY;  Surgeon: Daneil Dolin, MD;  Location: [facility];  Service: Endoscopy;  Laterality: N/A;  12:15 pm    Family History  Problem Relation Age of Onset  . Colon cancer Maternal Uncle   . Cancer Father   . Breast cancer Daughter    Social History:  reports that she has never smoked. She has never used smokeless tobacco. She reports that she does not drink alcohol or use illicit drugs.  Allergies:  Allergies  Allergen Reactions  . Penicillins Hives    Medications Prior to Admission  Medication Sig Dispense Refill  . aspirin EC 81 MG tablet Take 81 mg by mouth daily.    Marland Kitchen estrogen, conjugated,-medroxyprogesterone (PREMPRO) 0.625-2.5 MG per tablet Take 0.5 tablets by mouth daily.    Marland Kitchen ezetimibe  (ZETIA) 10 MG tablet Take 1 tablet (10 mg total) by mouth daily. 90 tablet 3  . losartan (COZAAR) 25 MG tablet Take 1 tablet (25 mg total) by mouth daily. 90 tablet 3  . naproxen sodium (ANAPROX) 220 MG tablet Take 40 mg by mouth daily as needed (pain).    Vladimir Faster Glycol-Propyl Glycol (SYSTANE OP) Place 1 drop into both eyes daily as needed.    Marland Kitchen estrogen, conjugated,-medroxyprogesterone (PREMPRO) 0.625-5 MG per tablet Take 1 tablet by mouth daily. Pt takes 1/2 tablet daily (Patient not taking: Reported on 10/18/2014) 30 tablet 11    ROS: History obtained from spouse and the patient  General ROS: negative for - chills, fatigue, fever, night sweats, weight gain or weight loss Psychological ROS: negative for - behavioral disorder, hallucinations, memory difficulties, mood swings or suicidal ideation Ophthalmic ROS: negative for - blurry vision, double vision, eye pain or loss of vision ENT ROS: negative for - epistaxis, nasal discharge, oral lesions, sore throat, tinnitus or vertigo Allergy and Immunology ROS: negative for - hives or itchy/watery eyes Hematological and Lymphatic ROS: negative for - bleeding problems, bruising or swollen lymph nodes Endocrine ROS: negative for - galactorrhea, hair pattern changes, polydipsia/polyuria or temperature intolerance Respiratory ROS: negative for - cough, hemoptysis, shortness of breath or wheezing Cardiovascular ROS: Positive for dyspnea Gastrointestinal ROS: negative for - abdominal pain, diarrhea, hematemesis, nausea/vomiting or stool incontinence Genito-Urinary ROS: negative for - dysuria, hematuria, incontinence or urinary frequency/urgency Musculoskeletal ROS: negative for - joint swelling or muscular weakness Neurological ROS: as  noted in HPI Dermatological ROS: negative for rash and skin lesion changes  Physical Examination: Blood pressure 141/78, pulse 76, temperature 97.9 F (36.6 C), temperature source Oral, resp. rate 18, height '5\' 1"'   (1.549 m), weight 61.236 kg (135 lb), SpO2 100 %.  HEENT-  Normocephalic, no lesions, without obvious abnormality.  Normal external eye and conjunctiva.  Normal TM's bilaterally.  Normal auditory canals and external ears. Normal external nose, mucus membranes and septum.  Normal pharynx. Neck supple with no masses, nodes, nodules or enlargement. Cardiovascular - regular rate and rhythm, S1, S2 normal, no murmur, click, rub or gallop Lungs - chest clear, no wheezing, rales, normal symmetric air entry Abdomen - soft, non-tender; bowel sounds normal; no masses,  no organomegaly Extremities - no joint deformities, effusion, or inflammation, no edema and no skin discoloration  Neurologic Examination: Mental Status: Alert, oriented, thought content appropriate.  Moderately severe expressive aphasia; no receptive aphasia. Able to follow commands without difficulty. Cranial Nerves: II-Visual fields were normal. III/IV/VI-Pupils were equal and reacted. Extraocular movements were full and conjugate.    V/VII-no facial numbness and no facial weakness. VIII-normal. X-normal speech. XI: trapezius strength/neck flexion strength normal bilaterally XII-midline tongue extension with normal strength. Motor: 5/5 bilaterally with normal tone and bulk Sensory: Normal throughout. Deep Tendon Reflexes: 2+ and symmetric. Plantars: Flexor bilaterally Cerebellar: Normal finger-to-nose testing. Carotid auscultation: Normal  Results for orders placed or performed during the hospital encounter of 10/20/14 (from the past 48 hour(s))  CBC     Status: None   Collection Time: 10/20/14  8:00 AM  Result Value Ref Range   WBC 5.7 4.0 - 10.5 K/uL   RBC 4.97 3.87 - 5.11 MIL/uL   Hemoglobin 14.5 12.0 - 15.0 g/dL   HCT 42.7 36.0 - 46.0 %   MCV 85.9 78.0 - 100.0 fL   MCH 29.2 26.0 - 34.0 pg   MCHC 34.0 30.0 - 36.0 g/dL   RDW 13.1 11.5 - 15.5 %   Platelets 246 150 - 400 K/uL  Basic metabolic panel     Status: Abnormal    Collection Time: 10/20/14  8:00 AM  Result Value Ref Range   Sodium 140 135 - 145 mmol/L   Potassium 4.0 3.5 - 5.1 mmol/L   Chloride 105 96 - 112 mmol/L   CO2 30 19 - 32 mmol/L   Glucose, Bld 99 70 - 99 mg/dL   BUN 18 6 - 23 mg/dL   Creatinine, Ser 1.08 0.50 - 1.10 mg/dL   Calcium 9.3 8.4 - 10.5 mg/dL   GFR calc non Af Amer 50 (L) >90 mL/min   GFR calc Af Amer 58 (L) >90 mL/min    Comment: (NOTE) The eGFR has been calculated using the CKD EPI equation. This calculation has not been validated in all clinical situations. eGFR's persistently <90 mL/min signify possible Chronic Kidney Disease.    Anion gap 5 5 - 15  Protime-INR     Status: None   Collection Time: 10/20/14  8:00 AM  Result Value Ref Range   Prothrombin Time 12.8 11.6 - 15.2 seconds   INR 0.95 0.00 - 1.49  Glucose, capillary     Status: Abnormal   Collection Time: 10/20/14  4:45 PM  Result Value Ref Range   Glucose-Capillary 111 (H) 70 - 99 mg/dL   Ct Head Wo Contrast  10/20/2014   CLINICAL DATA:  Code stroke, post cath expressive aphasia, possible CVA.  EXAM: CT HEAD WITHOUT CONTRAST  TECHNIQUE: Contiguous axial  images were obtained from the base of the skull through the vertex without intravenous contrast.  COMPARISON:  MRI brain dated 04/11/2006  FINDINGS: No evidence of parenchymal hemorrhage or extra-axial fluid collection. No mass lesion, mass effect, or midline shift.  No CT evidence of acute infarction.  Old lacunar infarct in the left caudate.  Small vessel ischemic changes.  Cerebral volume is within normal limits.  No ventriculomegaly.  The visualized paranasal sinuses are essentially clear. The mastoid air cells are unopacified.  No evidence of calvarial fracture.  IMPRESSION: No evidence of acute intracranial abnormality.  Old lacunar infarct in the caudate.  Small vessel ischemic changes.  These results were discussed in person at the time of interpretation on 10/20/2014 at 5:15 pm with Dr. Wallie Char,  who verbally acknowledged these results.   Electronically Signed   By: Julian Hy M.D.   On: 10/20/2014 17:25    Assessment: 74 y.o. female new onset expressive aphasia, most likely secondary to acute subcortical left MCA territory ischemic infarction.  Stroke Risk Factors - hyperlipidemia and hypertension  Plan: 1. HgbA1c, fasting lipid panel 2. MRI, MRA  of the brain without contrast 3. PT consult, OT consult, Speech consult 4. Echocardiogram 5. Carotid dopplers 6. Prophylactic therapy-Antiplatelet med: Aspirin  7. Risk factor modification 8. Telemetry monitoring  C.R. Nicole Kindred, MD [facility] [phone]  10/20/2014, 6:33 PM

## 2014-10-20 NOTE — Significant Event (Signed)
Rapid Response Event Note  Overview: Time Called: 4492 Arrival Time: 0100 Event Type: Neurologic  Initial Focused Assessment: Patient status post cath lab procedure.  Shortly after RN removed arterial sheath patient complained of a "migraine HA" then became aphasic.  Code Stroke called at 1642.  Stat head CT done.  Patient very anxious and hyper ventilating. 1 mg ativan given.  Stat MRI done.  Dr Nicole Kindred at bedside to assess patient. Husband at bedside during event. NIHSS 4  Interventions:   Event Summary: Name of Physician Notified: Dr Angelena Form at    Name of Consulting Physician Notified: Dr Nicole Kindred at 1642  Outcome: Stayed in room and stabalized  Event End Time: [place]  Raliegh Ip

## 2014-10-20 NOTE — Progress Notes (Signed)
1MG  ativan given IV per VO Dr Wallie Char Code Stroke Team.

## 2014-10-20 NOTE — CV Procedure (Signed)
Cardiac Catheterization Operative Report  Anna Hicks [id number] 2/17/20162:16 PM Asencion Noble, MD  Procedure Performed:  1. Left Heart Catheterization 2. Selective Coronary Angiography 3. Left ventricular angiogram 4. Fractional flow reserve of the mid LAD  Operator: Lauree Chandler, MD  Indication:  74 yo female with history of HTN, HLD who has had recent dyspnea. She underwent stress testing per primary care and had a questionable anterior wall perfusion defect. 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 Cardiac cath to exclude CAD.                                     Procedure Details: The risks, benefits, complications, treatment options, and expected outcomes were discussed with the patient. The patient and/or family concurred with the proposed plan, giving informed consent. The patient was brought to the cath lab after IV hydration was begun and oral premedication was given. The patient was further sedated with Versed and Fentanyl. The right groin was prepped and draped in the usual manner. Using the modified Seldinger access technique, a 5 French sheath was placed in the right femoral artery. Standard diagnostic catheters were used to perform selective coronary angiography. A pigtail catheter was used to perform a left ventricular angiogram. She had a mild to moderate mid LAD stenosis at the takeoff of both moderate caliber diagonal branches. I elected to perform a fractional flow reserve assessment of the mid LAD stenosis to document that this stenosis was not flow limiting.   Fractional flow reserve procedure: The sheath was up-sized to a 6 [place] system. She was given a weight based bolus of Angiomax and a drip was started. I then engaged the left main with a XB LAD 3.5 guiding catheter. I passed a Cougar IC wire down the LAD into the distal vessel. I then passed an Acist Navvus microcatheter (pressure measurement  catheter) into the mid LAD. The baseline FFR was 0.95. With 120 seconds of IV adenosine infusion at maximal hyperemia in the LAD, the FFR was 0.88 suggesting the mid LAD stenosis was not flow limiting. The guide and wire were removed.   There were no immediate complications. The patient was taken to the recovery area in stable condition.   Hemodynamic Findings: Central aortic pressure: 157/80 Left ventricular pressure: 157/3/13  Angiographic Findings:  Left main: No obstructive disease.   Left Anterior Descending Artery: Large caliber vessel that courses to the apex. The proximal vessel has mild luminal irregularities. The mid vessel has a 40-50% stenosis between the takeoff of two diagonal branches. The distal vessel has a focal 30% stenosis. The first diagonal branch is a small to moderate caliber vessel with ostial 30% stenosis. The second diagonal branch is a moderate caliber vessel with ostial 80% stenosis.   Circumflex Artery: Moderate caliber vessel with small caliber first obtuse marginal branch. There is mild plaque in the proximal, mid and distal AV groove Circumflex. The OM branch has no obstructive disease.   Right Coronary Artery: Large dominant vessel with no obstructive disease.   Left Ventricular Angiogram: LVEF=65-70%. No Mitral regurgitation noted.   Impression: 1. Moderate disease in the mid LAD which is not flow limiting by fractional flow reserve assessment 2. Moderately severe stenosis ostium of the moderate caliber diagonal branch. This vessel arises from the diseased segment of the mid LAD.  3. No obstructive  disease in the RCA or Circumflex 4. Normal LV systolic function  Recommendations: She has been having no chest pain. Her only complaint is mild dyspnea but she remains very active. As above, her RCA and Circumflex are patent with no stenosis. Her LAD has a moderate stenosis in the mid segment which is not flow limiting by FFR assessment. She does have disease in  the ostium of the Diagonal but the location makes PCI of this vessel very tricky. If there was an attempt to place a stent in the Diagonal, this would likely affect flow down the LAD. A true bifurcation stenting technique here would potentially compromise flow down one of the vessels in this trifurcation. I would attempt medical management of her Diagonal disease for now. Will add Imdur for now. She may also benefit from a beta blocker. If she fails medical management and has worsening of symptoms, could consider cutting balloon angioplasty of the Diagonal branch.        Complications:  None. The patient tolerated the procedure well.

## 2014-10-20 NOTE — Progress Notes (Signed)
Site area:RFA  Site Prior to Removal:  Level 0 Pressure Applied For:77min Manual:   yes Patient Status During Pull:  Vagal reaction Post Pull Site:  Level 0 Post Pull Instructions Given: yes  Post Pull Pulses Present: palpable Dressing Applied:clear   Bedrest begins @ 1388 Comments:HR to 45  SBP to74  0.6mg  atropine given IV by Eddie Dibbles RN, brief fluid bolus given. Rapid recovery seen.

## 2014-10-20 NOTE — Progress Notes (Signed)
To CT for stat   scan

## 2014-10-20 NOTE — Progress Notes (Signed)
Pt reports migraine headache and exhibits dysphagia and memory loss. Code Stroke called. Dr Clydene Fake in to evaluate. Code stroke team on scene.

## 2014-10-20 NOTE — H&P (View-Only) (Signed)
Patient ID: Anna Hicks, female   DOB: Apr 24, 1941, 74 y.o.   MRN: [id number]       CARDIOLOGY CONSULT NOTE  Patient ID: Anna Hicks MRN: [id number] DOB/AGE: Jan 29, 1941 74 y.o.  Admit date: (Not on file) Primary Physician Asencion Noble, MD  Reason for Consultation: abnormal stress test  HPI: The patient is a 74 year old woman with a history of hyperlipidemia intolerant to statin therapy. She had been experiencing shortness of breath and saw her primary care physician, Dr. Willey Blade. He ordered a nuclear stress test which demonstrated a reversible anterior wall defect suggestive of ischemia, calculated LVEF 79%. Normal regional wall motion was demonstrated. ECG today shows normal sinus rhythm, heart rate 61 bpm, with no ischemic ST segment or T-wave abnormalities. She is here with her son, Jenny Reichmann, who teaches physical education and health at [employer]. She has been experiencing exertional dyspnea for the past 6 months. 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 She denies chest pain, orthopnea, paroxysmal nocturnal dyspnea, and leg swelling. She had been snow skiing approximately 2 weeks ago and bent over and stood up and passed out for roughly 4 minutes. She also noticed that her hands turned purple the other day while shopping. She denies pain in her extremities. She has a wrist automated blood pressure cuff at home and checks it regularly, and systolic readings have consistently been in the 140-150 range.    Allergies  Allergen Reactions  . Penicillins Hives    Current Outpatient Prescriptions  Medication Sig Dispense Refill  . aspirin 81 MG tablet Take 81 mg by mouth daily.    Marland Kitchen estrogen, conjugated,-medroxyprogesterone (PREMPRO) 0.625-5 MG per tablet Take 1 tablet by mouth daily. Pt takes 1/2 tablet daily 30 tablet 11   No current facility-administered medications for this visit.    Past Medical History  Diagnosis  Date  . Arthritis     Past Surgical History  Procedure Laterality Date  . Colonoscopy  09/25/2011    Procedure: COLONOSCOPY;  Surgeon: Daneil Dolin, MD;  Location: [facility];  Service: Endoscopy;  Laterality: N/A;  12:15 pm    History   Social History  . Marital Status: Married    Spouse Name: N/A  . Number of Children: N/A  . Years of Education: N/A   Occupational History  . Not on file.   Social History Main Topics  . Smoking status: Never Smoker   . Smokeless tobacco: Never Used  . Alcohol Use: No  . Drug Use: No  . Sexual Activity:    Partners: Male    Birth Control/ Protection: Post-menopausal   Other Topics Concern  . Not on file   Social History Narrative     No family history of premature CAD in 1st degree relatives.  Prior to Admission medications   Medication Sig Start Date End Date Taking? Authorizing Provider  aspirin 81 MG tablet Take 81 mg by mouth daily.   Yes Historical Provider, MD  estrogen, conjugated,-medroxyprogesterone (PREMPRO) 0.625-5 MG per tablet Take 1 tablet by mouth daily. Pt takes 1/2 tablet daily 04/28/12  Yes Delsa Bern, MD     Review of systems complete and found to be negative unless listed above in HPI     Physical exam Blood pressure 159/85, pulse 65, height 5\' 1"  (1.549 m), weight 135 lb (61.236 kg), SpO2 96 %. General: NAD Neck: No JVD, no thyromegaly or thyroid nodule.  Lungs:  Clear to auscultation bilaterally with normal respiratory effort. CV: Nondisplaced PMI. Regular rate and rhythm, normal S1/S2, no S3/S4, no murmur.  No peripheral edema.  No carotid bruit.  Normal pedal pulses.  Abdomen: Soft, nontender, no hepatosplenomegaly, no distention.  Skin: Intact without lesions or rashes.  Neurologic: Alert and oriented x 3.  Psych: Normal affect. Extremities: No clubbing or cyanosis.  HEENT: Normal.   ECG: Most recent ECG reviewed.  Labs:  No results found for: WBC, HGB, HCT, MCV, PLT No results for  input(s): NA, K, CL, CO2, BUN, CREATININE, CALCIUM, PROT, BILITOT, ALKPHOS, ALT, AST, GLUCOSE in the last 168 hours.  Invalid input(s): LABALBU No results found for: CKTOTAL, CKMB, CKMBINDEX, TROPONINI No results found for: CHOL No results found for: HDL No results found for: LDLCALC No results found for: TRIG No results found for: CHOLHDL No results found for: LDLDIRECT       Studies: No results found.  ASSESSMENT AND PLAN:  1. Exertional dyspnea and syncope with abnormal nuclear stress test: There was no mention of anterior wall soft tissue attenuation artifact although regional wall motion was noted to be normal with normal LV systolic function. Her only complaint is shortness of breath. It appears she has untreated essential hypertension. I will obtain an echocardiogram to evaluate left ventricular systolic and diastolic function. In order to achieve diagnostic certainty, I will arrange for coronary angiography.  2. Essential hypertension: It appears she has had consistent systolic readings ranging from 140-150. I will start losartan 25 mg daily.  I will obtain an echocardiogram to evaluate left ventricular systolic and diastolic function and to evaluate for LVH and chamber size to assess chronicity of hypertension.  3. Hyperlipidemia: Appears to be intolerant to multiple statins. I will obtain a copy of lipids from PCP's office. I would consider initiating Zetia.  Dispo: f/u after cath.  Signed: Kate Sable, M.D., F.A.C.C.  10/15/2014, 9:41 AM  Labs reviewed. Normal renal function. Markedly elevated lipids (total cholesterol, triglycerides, and LDL). Will start Zetia 10 mg daily.

## 2014-10-20 NOTE — Interval H&P Note (Signed)
History and Physical Interval Note:  10/20/2014 1:05 PM  Anna Hicks  has presented today for cardiac cath with the diagnosis of abnormal nuclear stress test, dyspnea.  The various methods of treatment have been discussed with the patient and family. After consideration of risks, benefits and other options for treatment, the patient has consented to  Procedure(s): LEFT HEART CATHETERIZATION WITH CORONARY ANGIOGRAM (N/A) as a surgical intervention .  The patient's history has been reviewed, patient examined, no change in status, stable for surgery.  I have reviewed the patient's chart and labs.  Questions were answered to the patient's satisfaction.    Cath Lab Visit (complete for each Cath Lab visit)  Clinical Evaluation Leading to the Procedure:   ACS: No.  Non-ACS:    Anginal Classification: CCS III  Anti-ischemic medical therapy: No Therapy  Non-Invasive Test Results: Intermediate-risk stress test findings: cardiac mortality 1-3%/year  Prior CABG: No previous CABG         [name]

## 2014-10-21 DIAGNOSIS — I471 Supraventricular tachycardia: Secondary | ICD-10-CM | POA: Diagnosis present

## 2014-10-21 DIAGNOSIS — I639 Cerebral infarction, unspecified: Secondary | ICD-10-CM

## 2014-10-21 DIAGNOSIS — I4719 Other supraventricular tachycardia: Secondary | ICD-10-CM | POA: Diagnosis present

## 2014-10-21 DIAGNOSIS — I2510 Atherosclerotic heart disease of native coronary artery without angina pectoris: Secondary | ICD-10-CM | POA: Diagnosis not present

## 2014-10-21 DIAGNOSIS — R0609 Other forms of dyspnea: Secondary | ICD-10-CM

## 2014-10-21 DIAGNOSIS — R9430 Abnormal result of cardiovascular function study, unspecified: Secondary | ICD-10-CM | POA: Diagnosis present

## 2014-10-21 DIAGNOSIS — E785 Hyperlipidemia, unspecified: Secondary | ICD-10-CM

## 2014-10-21 DIAGNOSIS — R0600 Dyspnea, unspecified: Secondary | ICD-10-CM | POA: Diagnosis present

## 2014-10-21 DIAGNOSIS — I2583 Coronary atherosclerosis due to lipid rich plaque: Secondary | ICD-10-CM

## 2014-10-21 LAB — LIPID PANEL
Cholesterol: 207 mg/dL — ABNORMAL HIGH (ref 0–200)
HDL: 55 mg/dL (ref >39)
LDL CALC: 132 mg/dL — AB (ref 0–99)
TRIGLYCERIDES: 102 mg/dL (ref <150)
Total CHOL/HDL Ratio: 3.8 RATIO
VLDL: 20 mg/dL (ref 0–40)

## 2014-10-21 LAB — BASIC METABOLIC PANEL
Anion gap: 8 (ref 5–15)
BUN: 17 mg/dL (ref 6–23)
CO2: 24 mmol/L (ref 19–32)
CREATININE: 0.92 mg/dL (ref 0.50–1.10)
Calcium: 8.7 mg/dL (ref 8.4–10.5)
Chloride: 106 mmol/L (ref 96–112)
GFR calc non Af Amer: 60 mL/min — ABNORMAL LOW (ref >90)
GFR, EST AFRICAN AMERICAN: 70 mL/min — AB (ref >90)
Glucose, Bld: 123 mg/dL — ABNORMAL HIGH (ref 70–99)
Potassium: 4 mmol/L (ref 3.5–5.1)
Sodium: 138 mmol/L (ref 135–145)

## 2014-10-21 LAB — CBC
HCT: 39.3 % (ref 36.0–46.0)
Hemoglobin: 13.2 g/dL (ref 12.0–15.0)
MCH: 29.2 pg (ref 26.0–34.0)
MCHC: 33.6 g/dL (ref 30.0–36.0)
MCV: 86.9 fL (ref 78.0–100.0)
Platelets: 230 10*3/uL (ref 150–400)
RBC: 4.52 MIL/uL (ref 3.87–5.11)
RDW: 13.1 % (ref 11.5–15.5)
WBC: 7.8 10*3/uL (ref 4.0–10.5)

## 2014-10-21 LAB — TSH: TSH: 0.701 u[IU]/mL (ref 0.350–4.500)

## 2014-10-21 LAB — T4, FREE: FREE T4: 0.93 ng/dL (ref 0.80–1.80)

## 2014-10-21 MED ORDER — METOPROLOL TARTRATE 25 MG PO TABS: 12.5000 mg | ORAL_TABLET | Freq: Two times a day (BID) | ORAL | Status: DC

## 2014-10-21 MED ORDER — EZETIMIBE 10 MG PO TABS
10.0000 mg | ORAL_TABLET | Freq: Every day | ORAL | Status: DC
  Administered 2014-10-21: 10 mg via ORAL
  Filled 2014-10-21: qty 1

## 2014-10-21 MED ORDER — EZETIMIBE 10 MG PO TABS: 10.0000 mg | ORAL_TABLET | Freq: Every day | ORAL | Status: DC

## 2014-10-21 MED ORDER — METOPROLOL TARTRATE 25 MG PO TABS: 25.0000 mg | ORAL_TABLET | Freq: Two times a day (BID) | ORAL | Status: DC

## 2014-10-21 MED ORDER — ACETAMINOPHEN 325 MG PO TABS: 650.0000 mg | ORAL_TABLET | ORAL | Status: DC | PRN

## 2014-10-21 MED ORDER — METOPROLOL TARTRATE 25 MG PO TABS
25.0000 mg | ORAL_TABLET | Freq: Two times a day (BID) | ORAL | Status: DC
  Administered 2014-10-21: 25 mg via ORAL
  Filled 2014-10-21: qty 1

## 2014-10-21 MED ORDER — LOSARTAN POTASSIUM 25 MG PO TABS
25.0000 mg | ORAL_TABLET | Freq: Every day | ORAL | Status: DC
  Administered 2014-10-21: 11:00:00 25 mg via ORAL
  Filled 2014-10-21: qty 1

## 2014-10-21 MED ORDER — ISOSORBIDE MONONITRATE 15 MG HALF TABLET
15.0000 mg | ORAL_TABLET | Freq: Every day | ORAL | Status: DC
  Administered 2014-10-21: 11:00:00 15 mg via ORAL
  Filled 2014-10-21: qty 1

## 2014-10-21 MED ORDER — ISOSORBIDE MONONITRATE ER 30 MG PO TB24: 15.0000 mg | ORAL_TABLET | Freq: Every day | ORAL | Status: DC

## 2014-10-21 MED FILL — Sodium Chloride IV Soln 0.9%: INTRAVENOUS | Qty: 50 | Status: AC

## 2014-10-21 NOTE — Progress Notes (Signed)
Patient ambulated 300 feet in the hall with steady gait. Denies andy symptoms of lightheadedness, dizziness, weakness or feeling faint. States she feels good and is ready to go home.

## 2014-10-21 NOTE — Discharge Summary (Signed)
Patient ID: Anna Hicks,  MRN: [id number], DOB/AGE: [birth year] 74 y.o.  Admit date: 10/20/2014 Discharge date: 10/21/2014  Primary Care Provider: Asencion Noble, MD Primary Cardiologist: Dr Bronson Ing  Discharge Diagnoses Principal Problem:   DOE (dyspnea on exertion) Active Problems:   Abnormal cardiac function test   Expressive aphasia post cath-felt to be secondary to mirgaine   Essential hypertension   PAT (paroxysmal atrial tachycardia)   Dyslipidemia-statin intol   Coronary artery disease due to lipid rich plaque    Procedures: Coronary angiogram 10/20/14                        MRI brain 10/20/14                        Transcranial doppler 10/21/14  Hospital Course:  74 year old woman with a history of hyperlipidemia, intolerant to statin therapy. She had been experiencing shortness of breath and saw her primary care physician, Dr. Willey Blade. He ordered a nuclear stress test which demonstrated a reversible anterior wall defect suggestive of ischemia, calculated LVEF 79%. Normal regional wall motion was demonstrated. She was admitted for elective cath 10/20/14. This revealed moderate disease in the mid LAD which is not flow limiting by fractional flow reserve assessment, moderately severe stenosis ostium of the moderate caliber diagonal branch. This vessel arises from the diseased segment of the mid LAD. She had no obstructive disease in the RCA or Circumflex. She had normal LVF. Post cath she had expressive aphasia and code stroke was called. After evaluation by Neurology and MRI was done. It showed 2 small areas of equivocal ischemia involving the left cerebellar tonsil and right posterior frontal region. Significance is unclear since this is in an area that is anatomically unrelated to patient's symptoms of expressive aphasia, and artifact could not be ruled out. It  was fell the pt probably had complex migraine. Transcranial dopplers were done before discharge.    Discharge Vitals:    Blood pressure 117/74, pulse 81, temperature 98.6 F (37 C), temperature source Oral, resp. rate 18, height 5\' 1"  (1.549 m), weight 134 lb 14.7 oz (61.2 kg), SpO2 95 %.    Labs: Results for orders placed or performed during the hospital encounter of 10/20/14 (from the past 24 hour(s))  POCT Activated clotting time     Status: None   Collection Time: 10/20/14  1:49 PM  Result Value Ref Range   Activated Clotting Time 374 seconds  Glucose, capillary     Status: Abnormal   Collection Time: 10/20/14  4:45 PM  Result Value Ref Range   Glucose-Capillary 111 (H) 70 - 99 mg/dL   CK total and CKMB (cardiac)     Status: Abnormal   Collection Time: 10/20/14 10:12 PM  Result Value Ref Range   Total CK 110 7 - 177 U/L   CK, MB 12.5 (HH) 0.3 - 4.0 ng/mL   Relative Index 11.4 (H) 0.0 - 2.5  Basic metabolic panel     Status: Abnormal   Collection Time: 10/21/14  2:45 AM  Result Value Ref Range   Sodium 138 135 - 145 mmol/L   Potassium 4.0 3.5 - 5.1 mmol/L   Chloride 106 96 - 112 mmol/L   CO2 24 19 - 32 mmol/L   Glucose, Bld 123 (H) 70 - 99 mg/dL   BUN 17 6 - 23 mg/dL   Creatinine, Ser 0.92 0.50 - 1.10 mg/dL   Calcium 8.7  8.4 - 10.5 mg/dL   GFR calc non Af Amer 60 (L) >90 mL/min   GFR calc Af Amer 70 (L) >90 mL/min   Anion gap 8 5 - 15  CBC     Status: None   Collection Time: 10/21/14  2:45 AM  Result Value Ref Range   WBC 7.8 4.0 - 10.5 K/uL   RBC 4.52 3.87 - 5.11 MIL/uL   Hemoglobin 13.2 12.0 - 15.0 g/dL   HCT 39.3 36.0 - 46.0 %   MCV 86.9 78.0 - 100.0 fL   MCH 29.2 26.0 - 34.0 pg   MCHC 33.6 30.0 - 36.0 g/dL   RDW 13.1 11.5 - 15.5 %   Platelets 230 150 - 400 K/uL  TSH     Status: None   Collection Time: 10/21/14  2:45 AM  Result Value Ref Range   TSH 0.701 0.350 - 4.500 uIU/mL  T4, free     Status: None   Collection Time: 10/21/14  2:45 AM  Result Value Ref Range   Free T4 0.93 0.80 - 1.80 ng/dL  Lipid panel     Status: Abnormal   Collection Time: 10/21/14  2:45 AM  Result  Value Ref Range   Cholesterol 207 (H) 0 - 200 mg/dL   Triglycerides 102 <150 mg/dL   HDL 55 >39 mg/dL   Total CHOL/HDL Ratio 3.8 RATIO   VLDL 20 0 - 40 mg/dL   LDL Cholesterol 132 (H) 0 - 99 mg/dL    Disposition:  Follow-up Information    Follow up with Herminio Commons, MD On 11/08/2014.   Specialty:  Cardiology   Why:  2:40   Contact information:   [place] [id number] [phone]       Discharge Medications:    Medication List    STOP taking these medications        estrogen (conjugated)-medroxyprogesterone 0.625-2.5 MG per tablet  Commonly known as:  PREMPRO     estrogen (conjugated)-medroxyprogesterone 0.625-5 MG per tablet  Commonly known as:  PREMPRO      TAKE these medications        acetaminophen 325 MG tablet  Commonly known as:  TYLENOL  Take 2 tablets (650 mg total) by mouth every 4 (four) hours as needed for headache or mild pain.     aspirin EC 81 MG tablet  Take 81 mg by mouth daily.     ezetimibe 10 MG tablet  Commonly known as:  ZETIA  Take 1 tablet (10 mg total) by mouth daily.     ezetimibe 10 MG tablet  Commonly known as:  ZETIA  Take 1 tablet (10 mg total) by mouth daily.     isosorbide mononitrate 30 MG 24 hr tablet  Commonly known as:  IMDUR  Take 0.5 tablets (15 mg total) by mouth daily.     losartan 25 MG tablet  Commonly known as:  COZAAR  Take 1 tablet (25 mg total) by mouth daily.     metoprolol tartrate 25 MG tablet  Commonly known as:  LOPRESSOR  Take 1 tablet (25 mg total) by mouth 2 (two) times daily.     naproxen sodium 220 MG tablet  Commonly known as:  ANAPROX  Take 40 mg by mouth daily as needed (pain).     SYSTANE OP  Place 1 drop into both eyes daily as needed.         Duration of Discharge Encounter: Greater than 30 minutes including physician  time.  Angelena Form PA-C 10/21/2014 11:54 AM

## 2014-10-21 NOTE — Progress Notes (Signed)
Notified Dr. Nicole Kindred that Carotid Doppler and Transcranial Doppler completed. (See note from [facility]) Dr. Nicole Kindred said patient stable and able to discharge at this time.

## 2014-10-21 NOTE — Discharge Instructions (Signed)
Migraine Headache A migraine headache is an intense, throbbing pain on one or both sides of your head. A migraine can last for 30 minutes to several hours. CAUSES  The exact cause of a migraine headache is not always known. However, a migraine may be caused when nerves in the brain become irritated and release chemicals that cause inflammation. This causes pain. Certain things may also trigger migraines, such as:  Alcohol.  Smoking.  Stress.  Menstruation.  Aged cheeses.  Foods or drinks that contain nitrates, glutamate, aspartame, or tyramine.  Lack of sleep.  Chocolate.  Caffeine.  Hunger.  Physical exertion.  Fatigue.  Medicines used to treat chest pain (nitroglycerine), birth control pills, estrogen, and some blood pressure medicines. SIGNS AND SYMPTOMS  Pain on one or both sides of your head.  Pulsating or throbbing pain.  Severe pain that prevents daily activities.  Pain that is aggravated by any physical activity.  Nausea, vomiting, or both.  Dizziness.  Pain with exposure to bright lights, loud noises, or activity.  General sensitivity to bright lights, loud noises, or smells. Before you get a migraine, you may get warning signs that a migraine is coming (aura). An aura may include:  Seeing flashing lights.  Seeing bright spots, halos, or zigzag lines.  Having tunnel vision or blurred vision.  Having feelings of numbness or tingling.  Having trouble talking.  Having muscle weakness. DIAGNOSIS  A migraine headache is often diagnosed based on:  Symptoms.  Physical exam.  A CT scan or MRI of your head. These imaging tests cannot diagnose migraines, but they can help rule out other causes of headaches. TREATMENT Medicines may be given for pain and nausea. Medicines can also be given to help prevent recurrent migraines.  HOME CARE INSTRUCTIONS  Only take over-the-counter or prescription medicines for pain or discomfort as directed by your  health care provider. The use of long-term narcotics is not recommended.  Lie down in a dark, quiet room when you have a migraine.  Keep a journal to find out what may trigger your migraine headaches. For example, write down:  What you eat and drink.  How much sleep you get.  Any change to your diet or medicines.  Limit alcohol consumption.  Quit smoking if you smoke.  Get 7-9 hours of sleep, or as recommended by your health care provider.  Limit stress.  Keep lights dim if bright lights bother you and make your migraines worse. SEEK IMMEDIATE MEDICAL CARE IF:   Your migraine becomes severe.  You have a fever.  You have a stiff neck.  You have vision loss.  You have muscular weakness or loss of muscle control.  You start losing your balance or have trouble walking.  You feel faint or pass out.  You have severe symptoms that are different from your first symptoms. MAKE SURE YOU:   Understand these instructions.  Will watch your condition.  Will get help right away if you are not doing well or get worse. Document Released: 08/20/2005 Document Revised: 01/04/2014 Document Reviewed: 04/27/2013 [facility] Patient Information 2015 [place], [place]. This information is not intended to replace advice given to you by your health care provider. Make sure you discuss any questions you have with your health care provider. Radial Site Care Refer to this sheet in the next few weeks. These instructions provide you with information on caring for yourself after your procedure. Your caregiver may also give you more specific instructions. Your treatment has been planned according  to current medical practices, but problems sometimes occur. Call your caregiver if you have any problems or questions after your procedure. HOME CARE INSTRUCTIONS  You may shower the day after the procedure.Remove the bandage (dressing) and gently wash the site with plain soap and water.Gently pat the site  dry.  Do not apply powder or lotion to the site.  Do not submerge the affected site in water for 3 to 5 days.  Inspect the site at least twice daily.  Do not flex or bend the affected arm for 24 hours.  No lifting over 5 pounds (2.3 kg) for 5 days after your procedure.  Do not drive home if you are discharged the same day of the procedure. Have someone else drive you.  You may drive 24 hours after the procedure unless otherwise instructed by your caregiver.  Do not operate machinery or power tools for 24 hours.  A responsible adult should be with you for the first 24 hours after you arrive home. What to expect:  Any bruising will usually fade within 1 to 2 weeks.  Blood that collects in the tissue (hematoma) may be painful to the touch. It should usually decrease in size and tenderness within 1 to 2 weeks. SEEK IMMEDIATE MEDICAL CARE IF:  You have unusual pain at the radial site.  You have redness, warmth, swelling, or pain at the radial site.  You have drainage (other than a small amount of blood on the dressing).  You have chills.  You have a fever or persistent symptoms for more than 72 hours.  You have a fever and your symptoms suddenly get worse.  Your arm becomes pale, cool, tingly, or numb.  You have heavy bleeding from the site. Hold pressure on the site. Document Released: 09/22/2010 Document Revised: 11/12/2011 Document Reviewed: 09/22/2010 [facility] Patient Information 2015 [place], [place]. This information is not intended to replace advice given to you by your health care provider. Make sure you discuss any questions you have with your health care provider.

## 2014-10-21 NOTE — Progress Notes (Signed)
*  PRELIMINARY RESULTS* Vascular Ultrasound Carotid Duplex (Doppler) has been completed.   Findings suggest 1-39% internal carotid artery stenosis bilaterally. Vertebral arteries are patent with antegrade flow.   Bilateral lower extremity venous duplex completed. Bilateral lower extremities are negative for deep vein thrombosis. There is no evidence of Baker's cyst bilaterally.   Transcranial Doppler has been completed.    Transcranial Doppler with Bubbles has been completed.   The left MCA was insonated and agitated saline was inserted into the IV in the left forearm by Dr. Erlinda Hong. High Intensity Transient Signals (HITS) were heard at rest and during Valsalva suggestive of a small PFO.  10/21/2014  Maudry Mayhew, RVT, RDCS, RDMS

## 2014-10-21 NOTE — Progress Notes (Signed)
Subjective: Patient had no new complaints. She has had no recurrence of expressive aphasia since symptoms cleared yesterday. She also has not experienced recurrent headache.  Objective: Current vital signs: BP 117/74 mmHg  Pulse 81  Temp(Src) 98.6 F (37 C) (Oral)  Resp 18  Ht 5\' 1"  (1.549 m)  Wt 61.2 kg (134 lb 14.7 oz)  BMI 25.51 kg/m2  SpO2 95%  Neurologic Exam: Patient was alert and in no acute distress. Mental status was normal. There was no indication of receptive no expressive aphasia. No visual changes were noted. Speech was normal with no dysarthria. Patient moved extremities equally with no signs of focal weakness.  MRI of the brain showed 2 small areas of equivocal ischemia involving the left cerebellar tonsil and right posterior frontal region. Significance is unclear since he is a areas that are anatomically unrelated to patient's symptoms of expressive aphasia, and artifact could not be ruled out. There was no indication of an ischemic lesion involving the left MCA territory. MRA was limited due to movement. No large vessel occlusion was noted.  TTE showed equivocal PFO. Significance, with respect to history of migraine headaches, is unclear. Transcranial Doppler study with contrast  (bubble study) is scheduled for later today.  Medications: I have reviewed the patient's current medications.  Assessment/Plan: 74 year old lady who experienced expressive aphasia following cardiac catheterization yesterday. Symptoms occurred in association with her typical visual changes and a migraine headache. MRI did not show ischemic changes involving the left MCA territory, including when patient was still symptomatic. Complicated migraine is likely. TIA cannot be completely ruled out, but is less likely than migraine phenomenon with transient vasospasm. Significance of findings on MRI is unclear, and may well be artifactual.  Recommend continuing with risk assessment, including carotid  Doppler, transcranial Doppler and hemoglobin A1c. Continue aspirin for antiplatelet therapy, as well as Zetia for hyperlipidemia and losartan hypertension.  C.R. Nicole Kindred, MD [facility] [phone]  10/21/2014  9:17 AM

## 2014-10-21 NOTE — Progress Notes (Signed)
    Subjective:  No chest pain. Speech is clear this am  Objective:  Vital Signs in the last 24 hours: Temp:  [97.9 F (36.6 C)-98.5 F (36.9 C)] 98.5 F (36.9 C) (02/18 0400) Pulse Rate:  [47-94] 75 (02/18 0400) Resp:  [12-30] 20 (02/18 0400) BP: (74-176)/(35-95) 128/52 mmHg (02/18 0400) SpO2:  [96 %-100 %] 100 % (02/18 0400) Weight:  [134 lb 14.7 oz (61.2 kg)] 134 lb 14.7 oz (61.2 kg) (02/18 0000)  Intake/Output from previous day:  Intake/Output Summary (Last 24 hours) at 10/21/14 0823 Last data filed at 10/21/14 0417  Gross per 24 hour  Intake      0 ml  Output   1000 ml  Net  -1000 ml   . aspirin EC  81 mg Oral Daily  . ezetimibe  10 mg Oral Daily  . isosorbide mononitrate  15 mg Oral Daily  . losartan  25 mg Oral Daily  . metoprolol tartrate  12.5 mg Oral BID   Physical Exam: General appearance: alert, cooperative and no distress Lungs: clear to auscultation bilaterally Heart: regular rate and rhythm Extremities: Rt wrist without hematoma   Rate: 74  Rhythm: normal sinus rhythm and 13 beat run of PAT  Lab Results:  Recent Labs  10/20/14 0800 10/21/14 0245  WBC 5.7 7.8  HGB 14.5 13.2  PLT 246 230    Recent Labs  10/20/14 0800 10/21/14 0245  NA 140 138  K 4.0 4.0  CL 105 106  CO2 30 24  GLUCOSE 99 123*  BUN 18 17  CREATININE 1.08 0.92   No results for input(s): TROPONINI in the last 72 hours.  Invalid input(s): CK, MB  Recent Labs  10/20/14 0800  INR 0.95    Imaging: Imaging results have been reviewed  Cardiac Studies:  Assessment/Plan:  74 year old woman with a history of hyperlipidemia, intolerant to statin therapy. She had been experiencing shortness of breath and saw her primary care physician, Dr. Willey Blade. He ordered a nuclear stress test which demonstrated a reversible anterior wall defect suggestive of ischemia, calculated LVEF 79%. Normal regional wall motion was demonstrated. She was admitted for elective cath 10/20/14. This  revealed moderate disease in the mid LAD which is not flow limiting by fractional flow reserve assessment, moderately severe stenosis ostium of the moderate caliber diagonal branch. This vessel arises from the diseased segment of the mid LAD, and no obstructive disease in the RCA or Circumflex. She had b\normal LVF. Post cath she had expressive aphasia and code stroke was called. After evaluation by Neurology and MRI it was fell the pt probably had migraine.   Principal Problem:   DOE (dyspnea on exertion) Active Problems:   Abnormal cardiac function test   Expressive aphasia post cath-felt to be secondary to mirgaine   Essential hypertension   PAT (paroxysmal atrial tachycardia)   Dyslipidemia-statin intol   PLAN: She has migraine history of there is a question of a  PFO by TTE.  Transcranial doppler ordered today. She did have asymptomatic atrial tachycardia on telemetry, will review strips with MD. Add beta blocker, resume home other home meds. Add low dose Imdur for CAD.  Kerin Ransom PA-C [facility] [phone] 10/21/2014, 8:23 AM   Patient seen and examined. Agree with assessment and plan. No chest pain. For transcranial doppler. Increase lopressor to 25 mg bid. For dc later today   Troy Sine, MD, [facility] 10/21/2014 9:32 AM

## 2014-10-22 LAB — HEMOGLOBIN A1C
Hgb A1c MFr Bld: 5.3 % (ref 4.8–5.6)
MEAN PLASMA GLUCOSE: 105 mg/dL

## 2014-10-25 ENCOUNTER — Telehealth: Payer: Self-pay | Admitting: *Deleted

## 2014-10-25 NOTE — Telephone Encounter (Signed)
I had started losartan 25 mg daily as her SBP>150 mmHg. This is a low dose. Can stop it and see how she feels.

## 2014-10-25 NOTE — Telephone Encounter (Signed)
PT HUSBAND called, pt is weak and BP is staying under 100.

## 2014-10-25 NOTE — Telephone Encounter (Signed)
100s/50s HR unclear to pt very weak with occasional migraine headaches. Pt husband thinks recent addition of BP meds from d/c last Wednesday causing BP to drop and fatigue. Will forward to Dr. Bronson Ing

## 2014-10-25 NOTE — Addendum Note (Signed)
Addended by: Julian Hy T on: 10/25/2014 01:22 PM   Modules accepted: Orders, Medications

## 2014-10-25 NOTE — Telephone Encounter (Signed)
Pt husband made aware, will let us know how she feels after d/c losartan.

## 2014-11-08 ENCOUNTER — Encounter: Payer: Self-pay | Admitting: Cardiovascular Disease

## 2014-11-08 ENCOUNTER — Ambulatory Visit (INDEPENDENT_AMBULATORY_CARE_PROVIDER_SITE_OTHER): Payer: Medicare Other | Admitting: Cardiovascular Disease

## 2014-11-08 VITALS — BP 150/80 | HR 58 | Ht 61.0 in | Wt 138.0 lb

## 2014-11-08 DIAGNOSIS — I253 Aneurysm of heart: Secondary | ICD-10-CM

## 2014-11-08 DIAGNOSIS — I2510 Atherosclerotic heart disease of native coronary artery without angina pectoris: Secondary | ICD-10-CM

## 2014-11-08 DIAGNOSIS — E785 Hyperlipidemia, unspecified: Secondary | ICD-10-CM

## 2014-11-08 DIAGNOSIS — I10 Essential (primary) hypertension: Secondary | ICD-10-CM

## 2014-11-08 NOTE — Patient Instructions (Signed)
Continue all current medications. Your physician wants you to follow up in:  4 months.  You will receive a reminder letter in the mail one-two months in advance.  If you don't receive a letter, please call our office to schedule the follow up appointment   

## 2014-11-08 NOTE — Progress Notes (Signed)
Patient ID: Anna Hicks, female   DOB: 10/14/1940, 74 y.o.   MRN: [id number]      SUBJECTIVE: The patient presents for follow-up after undergoing coronary angiography foreign abnormal stress test. This demonstrated moderate disease in the LAD which was not flow limiting by fractional flow reserve assessment. She had moderately severe stenosis at the ostium at a moderate caliber diagonal branch but this vessel arose from the diseased segment of the mid LAD. She had no obstructive disease in the RCA or circumflex with normal left ventricular systolic function.  It was recommended that medical management with beta blockers and nitrates be attempted and if she were to have symptoms not controlled with optimal medical therapy, balloon angioplasty of the diagonal could be attempted.  Post cath she had some expressive aphasia and was evaluated by neurology and underwent brain MRI. This showed 2 small areas of equivocal ischemia involving the left cerebellar tonsil and right posterior frontal region. Ultimately it was deemed that her symptoms were probably due to a complex migraine.  Echocardiography demonstrated normal left ventricular systolic function and a redundant atrial septum.  She believes she is feeling much better after starting metoprolol and Imdur. She is here with her husband, Anna Hicks, and recently returned from a skiing trip to [place]. She denies chest pain and shortness of breath. She occasionally has ankle swelling. 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     Review of Systems: As per "subjective", otherwise negative.  Allergies  Allergen Reactions  . Penicillins Hives    Current Outpatient Prescriptions  Medication Sig Dispense Refill  . aspirin EC 81 MG tablet Take 81 mg by mouth daily.    Marland Kitchen ezetimibe (ZETIA) 10 MG tablet Take 1 tablet (10 mg total) by mouth daily. 90 tablet 3  .  isosorbide mononitrate (IMDUR) 30 MG 24 hr tablet Take 0.5 tablets (15 mg total) by mouth daily. 30 tablet 5  . metoprolol tartrate (LOPRESSOR) 25 MG tablet Take 1 tablet (25 mg total) by mouth 2 (two) times daily. 60 tablet 11  . naproxen sodium (ANAPROX) 220 MG tablet Take 40 mg by mouth daily as needed (pain).    Vladimir Faster Glycol-Propyl Glycol (SYSTANE OP) Place 1 drop into both eyes daily as needed.     No current facility-administered medications for this visit.    Past Medical History  Diagnosis Date  . Hypertension   . High cholesterol   . Coronary artery disease   . Migraine     "visual" (10/20/2014)  . Arthritis     "minor" (10/20/2014)  . Pneumonia ~ 2725    Past Surgical History  Procedure Laterality Date  . Colonoscopy  09/25/2011    Procedure: COLONOSCOPY;  Surgeon: Daneil Dolin, MD;  Location: [facility];  Service: Endoscopy;  Laterality: N/A;  12:15 pm  . Cardiac catheterization  10/20/2014  . Tonsillectomy    . Tubal ligation    . Left heart catheterization with coronary angiogram N/A 10/20/2014    Procedure: LEFT HEART CATHETERIZATION WITH CORONARY ANGIOGRAM;  Surgeon: Burnell Blanks, MD;  Location: [facility] CATH LAB;  Service: Cardiovascular;  Laterality: N/A;    History   Social History  . Marital Status: Married    Spouse Name: N/A  . Number of Children: N/A  . Years of Education: N/A   Occupational History  . Not on file.   Social History Main Topics  .  Smoking status: Never Smoker   . Smokeless tobacco: Never Used  . Alcohol Use: No  . Drug Use: No  . Sexual Activity:    Partners: Male    Birth Control/ Protection: Post-menopausal   Other Topics Concern  . Not on file   Social History Narrative     Filed Vitals:   11/08/14 1433  BP: 150/80  Pulse: 58  Height: 5\' 1"  (1.549 m)  Weight: 138 lb (62.596 kg)    PHYSICAL EXAM General: NAD HEENT: Normal. Neck: No JVD, no thyromegaly. Lungs: Clear to auscultation bilaterally with  normal respiratory effort. CV: Nondisplaced PMI.  Regular rate and rhythm, normal S1/S2, no S3/S4, no murmur. No pretibial or periankle edema.  No carotid bruit.  Normal pedal pulses.  Abdomen: Soft, nontender, no hepatosplenomegaly, no distention.  Neurologic: Alert and oriented x 3.  Psych: Normal affect. Skin: Normal. Musculoskeletal: Normal range of motion, no gross deformities. Extremities: No clubbing or cyanosis.   ECG: Most recent ECG reviewed.      ASSESSMENT AND PLAN: 1. CAD with moderate LAD and severe diagonal disease: Symptomatically stable and improved. Continue medical therapy with beta blockers and nitrates.   2. Essential hypertension: She did not tolerate 25 mg losartan which led to fatigue and low normal BP. BP elevated today but I reviewed several home readings and all are normal. Continue metoprolol.  3. Hyperlipidemia: Appears to be intolerant to multiple statins. Continue Zetia.  4. Redundant atrial septum: Will not pursue a bubble study and there is no clear indication for this at this time, and would not explain dyspnea.  Dispo: f/u 3-4 months.   Kate Sable, M.D., F.A.C.C.

## 2015-02-28 ENCOUNTER — Other Ambulatory Visit: Payer: Self-pay

## 2015-12-13 ENCOUNTER — Ambulatory Visit (INDEPENDENT_AMBULATORY_CARE_PROVIDER_SITE_OTHER): Payer: Medicare Other | Admitting: Cardiovascular Disease

## 2015-12-13 ENCOUNTER — Encounter: Payer: Self-pay | Admitting: Cardiovascular Disease

## 2015-12-13 VITALS — BP 100/68 | HR 64 | Ht 61.0 in | Wt 139.0 lb

## 2015-12-13 DIAGNOSIS — R0602 Shortness of breath: Secondary | ICD-10-CM

## 2015-12-13 DIAGNOSIS — I25118 Atherosclerotic heart disease of native coronary artery with other forms of angina pectoris: Secondary | ICD-10-CM

## 2015-12-13 DIAGNOSIS — E785 Hyperlipidemia, unspecified: Secondary | ICD-10-CM

## 2015-12-13 DIAGNOSIS — I10 Essential (primary) hypertension: Secondary | ICD-10-CM

## 2015-12-13 DIAGNOSIS — I253 Aneurysm of heart: Secondary | ICD-10-CM

## 2015-12-13 MED ORDER — ISOSORBIDE MONONITRATE ER 30 MG PO TB24: 15.0000 mg | ORAL_TABLET | Freq: Every day | ORAL | Status: AC

## 2015-12-13 MED ORDER — METOPROLOL TARTRATE 25 MG PO TABS: 12.5000 mg | ORAL_TABLET | Freq: Two times a day (BID) | ORAL | Status: DC

## 2015-12-13 NOTE — Progress Notes (Signed)
Patient ID: Anna Hicks, female   DOB: [birth year], 75 y.o.   MRN: [id number]      SUBJECTIVE: The patient presents for follow-up of CAD. Coronary angiography in 2016 demonstrated moderate disease in the LAD which was not flow limiting by fractional flow reserve assessment. She had moderately severe stenosis at the ostium at a moderate caliber diagonal branch but this vessel arose from the diseased segment of the mid LAD. She had no obstructive disease in the RCA or circumflex with normal left ventricular systolic function.  It was recommended that medical management with beta blockers and nitrates be attempted and if she were to have symptoms not controlled with optimal medical therapy, balloon angioplasty of the diagonal could be attempted. Echocardiography demonstrated normal left ventricular systolic function and a redundant atrial septum.  ECG performed in the office today demonstrates normal sinus rhythm with no ischemic ST segment or T-wave abnormalities, nor any arrhythmias.  The patient denies any symptoms of chest pain, palpitations, lightheadedness, dizziness, leg swelling, orthopnea, PND, and syncope. Has some mild shortness of breath which is unchanged.  Recently took a ski trip to [place] and did very well.   Soc: Their son, Jenny Reichmann, teaches physical education and health at [employer].  Review of Systems: As per "subjective", otherwise negative.  Allergies  Allergen Reactions  . Penicillins Hives    Current Outpatient Prescriptions  Medication Sig Dispense Refill  . aspirin EC 81 MG tablet Take 81 mg by mouth daily.    Marland Kitchen ezetimibe (ZETIA) 10 MG tablet Take 1 tablet (10 mg total) by mouth daily. 90 tablet 3  . isosorbide mononitrate (IMDUR) 30 MG 24 hr tablet Take 0.5 tablets (15 mg total) by mouth daily. 30 tablet 5  . losartan (COZAAR) 25 MG tablet Take 25 mg by mouth daily.    . metoprolol tartrate (LOPRESSOR) 25 MG tablet Take 12.5 mg by mouth 2 (two)  times daily.    Vladimir Faster Glycol-Propyl Glycol (SYSTANE OP) Place 1 drop into both eyes daily as needed.     No current facility-administered medications for this visit.    Past Medical History  Diagnosis Date  . Hypertension   . High cholesterol   . Coronary artery disease   . Migraine     "visual" (10/20/2014)  . Arthritis     "minor" (10/20/2014)  . Pneumonia ~ [id number]    Past Surgical History  Procedure Laterality Date  . Colonoscopy  09/25/2011    Procedure: COLONOSCOPY;  Surgeon: Daneil Dolin, MD;  [facility];  Service: Endoscopy;  Laterality: N/A;  12:15 pm  . Cardiac catheterization  10/20/2014  . Tonsillectomy    . Tubal ligation    . Left heart catheterization with coronary angiogram N/A 10/20/2014    Procedure: LEFT HEART CATHETERIZATION WITH CORONARY ANGIOGRAM;  Surgeon: Burnell Blanks, MD;  Location: [facility] CATH LAB;  Service: Cardiovascular;  Laterality: N/A;    Social History   Social History  . Marital Status: Married    Spouse Name: N/A  . Number of Children: N/A  . Years of Education: N/A   Occupational History  . Not on file.   Social History Main Topics  . Smoking status: Never Smoker   . Smokeless tobacco: Never Used  . Alcohol Use: No  . Drug Use: No  . Sexual Activity:    Partners: Male    Birth Control/ Protection: Post-menopausal   Other Topics Concern  . Not on file  Social History Narrative     Filed Vitals:   12/13/15 1410  BP: 100/68  Pulse: 64  Height: 5\' 1"  (1.549 m)  Weight: 139 lb (63.05 kg)  SpO2: 98%    PHYSICAL EXAM General: NAD HEENT: Normal. Neck: No JVD, no thyromegaly. Lungs: Clear to auscultation bilaterally with normal respiratory effort. CV: Nondisplaced PMI.  Regular rate and rhythm, normal S1/S2, no S3/S4, no murmur. No pretibial or periankle edema.  No carotid bruit.   Abdomen: Soft, nontender, no distention.  Neurologic: Alert and oriented.  Psych: Normal affect. Skin:  Normal. Musculoskeletal: No gross deformities.  ECG: Most recent ECG reviewed.      ASSESSMENT AND PLAN: 1. CAD with moderate LAD and severe diagonal disease: Symptomatically stable. Continue medical therapy with ASA, beta blockers, and nitrates.   2. Essential hypertension: Controlled. No changes.  3. Hyperlipidemia: Appears to be intolerant to multiple statins. Continue Zetia.  Dispo: She prefers to fu prn.  Kate Sable, M.D., F.A.C.C.

## 2015-12-13 NOTE — Patient Instructions (Signed)
Continue all current medications. Follow up as needed  

## 2016-12-04 DIAGNOSIS — I10 Essential (primary) hypertension: Secondary | ICD-10-CM | POA: Diagnosis not present

## 2016-12-04 DIAGNOSIS — Z79899 Other long term (current) drug therapy: Secondary | ICD-10-CM | POA: Diagnosis not present

## 2016-12-04 DIAGNOSIS — E785 Hyperlipidemia, unspecified: Secondary | ICD-10-CM | POA: Diagnosis not present

## 2016-12-11 DIAGNOSIS — N3281 Overactive bladder: Secondary | ICD-10-CM | POA: Diagnosis not present

## 2016-12-11 DIAGNOSIS — Z6827 Body mass index (BMI) 27.0-27.9, adult: Secondary | ICD-10-CM | POA: Diagnosis not present

## 2016-12-11 DIAGNOSIS — E785 Hyperlipidemia, unspecified: Secondary | ICD-10-CM | POA: Diagnosis not present

## 2016-12-11 DIAGNOSIS — I10 Essential (primary) hypertension: Secondary | ICD-10-CM | POA: Diagnosis not present

## 2016-12-17 DIAGNOSIS — N951 Menopausal and female climacteric states: Secondary | ICD-10-CM | POA: Diagnosis not present

## 2016-12-17 DIAGNOSIS — Z1231 Encounter for screening mammogram for malignant neoplasm of breast: Secondary | ICD-10-CM | POA: Diagnosis not present

## 2016-12-17 DIAGNOSIS — Z01419 Encounter for gynecological examination (general) (routine) without abnormal findings: Secondary | ICD-10-CM | POA: Diagnosis not present

## 2016-12-17 DIAGNOSIS — R32 Unspecified urinary incontinence: Secondary | ICD-10-CM | POA: Diagnosis not present

## 2016-12-17 DIAGNOSIS — Z6826 Body mass index (BMI) 26.0-26.9, adult: Secondary | ICD-10-CM | POA: Diagnosis not present

## 2017-03-20 DIAGNOSIS — R69 Illness, unspecified: Secondary | ICD-10-CM | POA: Diagnosis not present

## 2017-03-27 DIAGNOSIS — H25813 Combined forms of age-related cataract, bilateral: Secondary | ICD-10-CM | POA: Diagnosis not present

## 2017-04-12 DIAGNOSIS — H903 Sensorineural hearing loss, bilateral: Secondary | ICD-10-CM | POA: Diagnosis not present

## 2017-06-18 DIAGNOSIS — Z23 Encounter for immunization: Secondary | ICD-10-CM | POA: Diagnosis not present

## 2017-09-25 DIAGNOSIS — I2510 Atherosclerotic heart disease of native coronary artery without angina pectoris: Secondary | ICD-10-CM | POA: Diagnosis not present

## 2017-09-25 DIAGNOSIS — Z79899 Other long term (current) drug therapy: Secondary | ICD-10-CM | POA: Diagnosis not present

## 2017-09-25 DIAGNOSIS — E785 Hyperlipidemia, unspecified: Secondary | ICD-10-CM | POA: Diagnosis not present

## 2017-09-25 DIAGNOSIS — I10 Essential (primary) hypertension: Secondary | ICD-10-CM | POA: Diagnosis not present

## 2017-10-02 DIAGNOSIS — I2510 Atherosclerotic heart disease of native coronary artery without angina pectoris: Secondary | ICD-10-CM | POA: Diagnosis not present

## 2017-10-02 DIAGNOSIS — E785 Hyperlipidemia, unspecified: Secondary | ICD-10-CM | POA: Diagnosis not present

## 2017-10-02 DIAGNOSIS — Z6827 Body mass index (BMI) 27.0-27.9, adult: Secondary | ICD-10-CM | POA: Diagnosis not present

## 2017-12-31 DIAGNOSIS — E559 Vitamin D deficiency, unspecified: Secondary | ICD-10-CM | POA: Diagnosis not present

## 2017-12-31 DIAGNOSIS — Z1231 Encounter for screening mammogram for malignant neoplasm of breast: Secondary | ICD-10-CM | POA: Diagnosis not present

## 2017-12-31 DIAGNOSIS — M8580 Other specified disorders of bone density and structure, unspecified site: Secondary | ICD-10-CM | POA: Diagnosis not present

## 2017-12-31 DIAGNOSIS — N952 Postmenopausal atrophic vaginitis: Secondary | ICD-10-CM | POA: Diagnosis not present

## 2017-12-31 DIAGNOSIS — R32 Unspecified urinary incontinence: Secondary | ICD-10-CM | POA: Diagnosis not present

## 2017-12-31 DIAGNOSIS — Z6826 Body mass index (BMI) 26.0-26.9, adult: Secondary | ICD-10-CM | POA: Diagnosis not present

## 2017-12-31 DIAGNOSIS — Z01419 Encounter for gynecological examination (general) (routine) without abnormal findings: Secondary | ICD-10-CM | POA: Diagnosis not present

## 2018-01-09 DIAGNOSIS — R69 Illness, unspecified: Secondary | ICD-10-CM | POA: Diagnosis not present

## 2018-03-31 DIAGNOSIS — I2510 Atherosclerotic heart disease of native coronary artery without angina pectoris: Secondary | ICD-10-CM | POA: Diagnosis not present

## 2018-03-31 DIAGNOSIS — G3184 Mild cognitive impairment, so stated: Secondary | ICD-10-CM | POA: Diagnosis not present

## 2018-04-08 DIAGNOSIS — M810 Age-related osteoporosis without current pathological fracture: Secondary | ICD-10-CM | POA: Diagnosis not present

## 2018-04-14 DIAGNOSIS — H903 Sensorineural hearing loss, bilateral: Secondary | ICD-10-CM | POA: Diagnosis not present

## 2018-04-21 DIAGNOSIS — R69 Illness, unspecified: Secondary | ICD-10-CM | POA: Diagnosis not present

## 2018-04-22 DIAGNOSIS — H52223 Regular astigmatism, bilateral: Secondary | ICD-10-CM | POA: Diagnosis not present

## 2018-04-22 DIAGNOSIS — H25813 Combined forms of age-related cataract, bilateral: Secondary | ICD-10-CM | POA: Diagnosis not present

## 2018-04-22 DIAGNOSIS — H5203 Hypermetropia, bilateral: Secondary | ICD-10-CM | POA: Diagnosis not present

## 2018-06-24 DIAGNOSIS — Z23 Encounter for immunization: Secondary | ICD-10-CM | POA: Diagnosis not present

## 2018-08-25 ENCOUNTER — Encounter: Payer: Self-pay | Admitting: Internal Medicine

## 2018-09-29 DIAGNOSIS — I2510 Atherosclerotic heart disease of native coronary artery without angina pectoris: Secondary | ICD-10-CM | POA: Diagnosis not present

## 2018-09-29 DIAGNOSIS — Z79899 Other long term (current) drug therapy: Secondary | ICD-10-CM | POA: Diagnosis not present

## 2018-09-29 DIAGNOSIS — R413 Other amnesia: Secondary | ICD-10-CM | POA: Diagnosis not present

## 2018-10-06 DIAGNOSIS — I2510 Atherosclerotic heart disease of native coronary artery without angina pectoris: Secondary | ICD-10-CM | POA: Diagnosis not present

## 2018-10-06 DIAGNOSIS — E785 Hyperlipidemia, unspecified: Secondary | ICD-10-CM | POA: Diagnosis not present

## 2018-10-22 DIAGNOSIS — R69 Illness, unspecified: Secondary | ICD-10-CM | POA: Diagnosis not present

## 2018-10-24 DIAGNOSIS — H40033 Anatomical narrow angle, bilateral: Secondary | ICD-10-CM | POA: Diagnosis not present

## 2018-10-24 DIAGNOSIS — H5203 Hypermetropia, bilateral: Secondary | ICD-10-CM | POA: Diagnosis not present

## 2018-10-24 DIAGNOSIS — H04203 Unspecified epiphora, bilateral lacrimal glands: Secondary | ICD-10-CM | POA: Diagnosis not present

## 2018-10-24 DIAGNOSIS — H25813 Combined forms of age-related cataract, bilateral: Secondary | ICD-10-CM | POA: Diagnosis not present

## 2018-10-24 DIAGNOSIS — H52223 Regular astigmatism, bilateral: Secondary | ICD-10-CM | POA: Diagnosis not present

## 2019-03-03 ENCOUNTER — Other Ambulatory Visit: Payer: Self-pay

## 2019-03-03 ENCOUNTER — Other Ambulatory Visit: Payer: Medicare Other

## 2019-03-03 DIAGNOSIS — Z20822 Contact with and (suspected) exposure to covid-19: Secondary | ICD-10-CM

## 2019-03-10 LAB — NOVEL CORONAVIRUS, NAA: SARS-CoV-2, NAA: NOT DETECTED

## 2019-03-16 DIAGNOSIS — M810 Age-related osteoporosis without current pathological fracture: Secondary | ICD-10-CM | POA: Diagnosis not present

## 2019-03-16 DIAGNOSIS — I2510 Atherosclerotic heart disease of native coronary artery without angina pectoris: Secondary | ICD-10-CM | POA: Diagnosis not present

## 2019-03-16 DIAGNOSIS — M791 Myalgia, unspecified site: Secondary | ICD-10-CM | POA: Diagnosis not present

## 2019-04-21 DIAGNOSIS — R69 Illness, unspecified: Secondary | ICD-10-CM | POA: Diagnosis not present

## 2019-04-23 DIAGNOSIS — H52223 Regular astigmatism, bilateral: Secondary | ICD-10-CM | POA: Diagnosis not present

## 2019-04-23 DIAGNOSIS — H5203 Hypermetropia, bilateral: Secondary | ICD-10-CM | POA: Diagnosis not present

## 2019-04-23 DIAGNOSIS — H25813 Combined forms of age-related cataract, bilateral: Secondary | ICD-10-CM | POA: Diagnosis not present

## 2019-04-23 DIAGNOSIS — H524 Presbyopia: Secondary | ICD-10-CM | POA: Diagnosis not present

## 2019-04-23 DIAGNOSIS — H40053 Ocular hypertension, bilateral: Secondary | ICD-10-CM | POA: Diagnosis not present

## 2019-04-23 DIAGNOSIS — H40013 Open angle with borderline findings, low risk, bilateral: Secondary | ICD-10-CM | POA: Diagnosis not present

## 2019-04-23 DIAGNOSIS — H40051 Ocular hypertension, right eye: Secondary | ICD-10-CM | POA: Diagnosis not present

## 2019-06-01 DIAGNOSIS — Z23 Encounter for immunization: Secondary | ICD-10-CM | POA: Diagnosis not present

## 2019-09-10 DIAGNOSIS — I2510 Atherosclerotic heart disease of native coronary artery without angina pectoris: Secondary | ICD-10-CM | POA: Diagnosis not present

## 2019-09-10 DIAGNOSIS — Z79899 Other long term (current) drug therapy: Secondary | ICD-10-CM | POA: Diagnosis not present

## 2019-09-10 DIAGNOSIS — M810 Age-related osteoporosis without current pathological fracture: Secondary | ICD-10-CM | POA: Diagnosis not present

## 2019-09-10 DIAGNOSIS — I10 Essential (primary) hypertension: Secondary | ICD-10-CM | POA: Diagnosis not present

## 2019-09-10 DIAGNOSIS — T466X5A Adverse effect of antihyperlipidemic and antiarteriosclerotic drugs, initial encounter: Secondary | ICD-10-CM | POA: Diagnosis not present

## 2019-09-17 DIAGNOSIS — H40019 Open angle with borderline findings, low risk, unspecified eye: Secondary | ICD-10-CM | POA: Diagnosis not present

## 2019-09-18 DIAGNOSIS — M810 Age-related osteoporosis without current pathological fracture: Secondary | ICD-10-CM | POA: Diagnosis not present

## 2019-09-18 DIAGNOSIS — I2510 Atherosclerotic heart disease of native coronary artery without angina pectoris: Secondary | ICD-10-CM | POA: Diagnosis not present

## 2019-09-18 DIAGNOSIS — E785 Hyperlipidemia, unspecified: Secondary | ICD-10-CM | POA: Diagnosis not present

## 2019-10-27 DIAGNOSIS — H401131 Primary open-angle glaucoma, bilateral, mild stage: Secondary | ICD-10-CM | POA: Diagnosis not present

## 2019-10-27 DIAGNOSIS — R69 Illness, unspecified: Secondary | ICD-10-CM | POA: Diagnosis not present

## 2019-11-23 DIAGNOSIS — H168 Other keratitis: Secondary | ICD-10-CM | POA: Diagnosis not present

## 2019-11-25 DIAGNOSIS — H18231 Secondary corneal edema, right eye: Secondary | ICD-10-CM | POA: Diagnosis not present

## 2019-11-25 DIAGNOSIS — H524 Presbyopia: Secondary | ICD-10-CM | POA: Diagnosis not present

## 2019-11-25 DIAGNOSIS — H5203 Hypermetropia, bilateral: Secondary | ICD-10-CM | POA: Diagnosis not present

## 2019-11-25 DIAGNOSIS — H52223 Regular astigmatism, bilateral: Secondary | ICD-10-CM | POA: Diagnosis not present

## 2019-12-09 DIAGNOSIS — H52223 Regular astigmatism, bilateral: Secondary | ICD-10-CM | POA: Diagnosis not present

## 2019-12-09 DIAGNOSIS — H524 Presbyopia: Secondary | ICD-10-CM | POA: Diagnosis not present

## 2019-12-09 DIAGNOSIS — H5203 Hypermetropia, bilateral: Secondary | ICD-10-CM | POA: Diagnosis not present

## 2019-12-09 DIAGNOSIS — H168 Other keratitis: Secondary | ICD-10-CM | POA: Diagnosis not present

## 2020-02-02 DIAGNOSIS — H401121 Primary open-angle glaucoma, left eye, mild stage: Secondary | ICD-10-CM | POA: Diagnosis not present

## 2020-02-02 DIAGNOSIS — H401111 Primary open-angle glaucoma, right eye, mild stage: Secondary | ICD-10-CM | POA: Diagnosis not present

## 2020-02-02 DIAGNOSIS — H25813 Combined forms of age-related cataract, bilateral: Secondary | ICD-10-CM | POA: Diagnosis not present

## 2020-03-17 DIAGNOSIS — R0789 Other chest pain: Secondary | ICD-10-CM | POA: Diagnosis not present

## 2020-03-17 DIAGNOSIS — R001 Bradycardia, unspecified: Secondary | ICD-10-CM | POA: Diagnosis not present

## 2020-04-18 DIAGNOSIS — R079 Chest pain, unspecified: Secondary | ICD-10-CM | POA: Diagnosis not present

## 2020-06-10 DIAGNOSIS — H5203 Hypermetropia, bilateral: Secondary | ICD-10-CM | POA: Diagnosis not present

## 2020-06-10 DIAGNOSIS — H52223 Regular astigmatism, bilateral: Secondary | ICD-10-CM | POA: Diagnosis not present

## 2020-06-10 DIAGNOSIS — S0501XA Injury of conjunctiva and corneal abrasion without foreign body, right eye, initial encounter: Secondary | ICD-10-CM | POA: Diagnosis not present

## 2020-06-21 DIAGNOSIS — Z23 Encounter for immunization: Secondary | ICD-10-CM | POA: Diagnosis not present

## 2020-06-28 DIAGNOSIS — R69 Illness, unspecified: Secondary | ICD-10-CM | POA: Diagnosis not present

## 2020-08-03 DIAGNOSIS — I2510 Atherosclerotic heart disease of native coronary artery without angina pectoris: Secondary | ICD-10-CM | POA: Diagnosis not present

## 2020-08-03 DIAGNOSIS — M816 Localized osteoporosis [Lequesne]: Secondary | ICD-10-CM | POA: Diagnosis not present

## 2020-08-03 DIAGNOSIS — R3915 Urgency of urination: Secondary | ICD-10-CM | POA: Diagnosis not present

## 2020-08-04 DIAGNOSIS — H401131 Primary open-angle glaucoma, bilateral, mild stage: Secondary | ICD-10-CM | POA: Diagnosis not present

## 2020-08-04 DIAGNOSIS — H2513 Age-related nuclear cataract, bilateral: Secondary | ICD-10-CM | POA: Diagnosis not present

## 2020-08-04 DIAGNOSIS — H401111 Primary open-angle glaucoma, right eye, mild stage: Secondary | ICD-10-CM | POA: Diagnosis not present

## 2020-08-04 DIAGNOSIS — H25813 Combined forms of age-related cataract, bilateral: Secondary | ICD-10-CM | POA: Diagnosis not present

## 2020-08-04 DIAGNOSIS — H5203 Hypermetropia, bilateral: Secondary | ICD-10-CM | POA: Diagnosis not present

## 2020-08-04 DIAGNOSIS — H52223 Regular astigmatism, bilateral: Secondary | ICD-10-CM | POA: Diagnosis not present

## 2020-08-05 ENCOUNTER — Other Ambulatory Visit (HOSPITAL_COMMUNITY): Payer: Self-pay | Admitting: Internal Medicine

## 2020-08-05 DIAGNOSIS — Z1231 Encounter for screening mammogram for malignant neoplasm of breast: Secondary | ICD-10-CM

## 2020-08-05 DIAGNOSIS — Z780 Asymptomatic menopausal state: Secondary | ICD-10-CM

## 2020-08-31 ENCOUNTER — Ambulatory Visit (HOSPITAL_COMMUNITY)
Admission: RE | Admit: 2020-08-31 | Discharge: 2020-08-31 | Disposition: A | Discharge status: Home / Self Care | Payer: Medicare HMO | Source: Ambulatory Visit | Attending: Internal Medicine | Admitting: Internal Medicine

## 2020-08-31 ENCOUNTER — Other Ambulatory Visit: Payer: Self-pay

## 2020-08-31 DIAGNOSIS — R29890 Loss of height: Secondary | ICD-10-CM | POA: Diagnosis not present

## 2020-08-31 DIAGNOSIS — Z780 Asymptomatic menopausal state: Secondary | ICD-10-CM | POA: Diagnosis not present

## 2020-08-31 DIAGNOSIS — M8589 Other specified disorders of bone density and structure, multiple sites: Secondary | ICD-10-CM | POA: Diagnosis not present

## 2020-08-31 DIAGNOSIS — Z1231 Encounter for screening mammogram for malignant neoplasm of breast: Secondary | ICD-10-CM

## 2020-09-05 DIAGNOSIS — Z0000 Encounter for general adult medical examination without abnormal findings: Secondary | ICD-10-CM | POA: Diagnosis not present

## 2021-03-28 ENCOUNTER — Ambulatory Visit
Admission: RE | Admit: 2021-03-28 | Discharge: 2021-03-28 | Disposition: A | Discharge status: Home / Self Care | Payer: BC Managed Care – PPO | Source: Ambulatory Visit | Attending: Family Medicine | Admitting: Family Medicine

## 2021-03-28 ENCOUNTER — Other Ambulatory Visit: Payer: Self-pay

## 2021-03-28 VITALS — BP 119/80 | HR 100 | Temp 100.4°F | Resp 20

## 2021-03-28 DIAGNOSIS — R5383 Other fatigue: Secondary | ICD-10-CM | POA: Diagnosis not present

## 2021-03-28 DIAGNOSIS — R52 Pain, unspecified: Secondary | ICD-10-CM

## 2021-03-28 LAB — POCT URINALYSIS DIP (MANUAL ENTRY)
Glucose, UA: NEGATIVE mg/dL
Leukocytes, UA: NEGATIVE
Nitrite, UA: NEGATIVE
Protein Ur, POC: >=300 mg/dL — AB
Spec Grav, UA: 1.025 (ref 1.010–1.025)
Urobilinogen, UA: 1 E.U./dL
pH, UA: 5.5 (ref 5.0–8.0)

## 2021-03-28 LAB — POCT FASTING CBG KUC MANUAL ENTRY: POCT Glucose (KUC): 117 mg/dL — AB (ref 70–99)

## 2021-03-28 NOTE — ED Triage Notes (Signed)
Removed ticlk last week

## 2021-03-28 NOTE — Discharge Instructions (Addendum)
You have been tested for COVID-19 today. °If your test returns positive, you will receive a phone call from [facility] regarding your results. °Negative test results are not called. °Both positive and negative results area always visible on MyChart. °If you do not have a MyChart account, sign up instructions are provided in your discharge papers. °Please do not hesitate to contact us should you have questions or concerns. ° °

## 2021-03-28 NOTE — ED Triage Notes (Signed)
Pt presents with fever , weakness and feeling unwell since Saturday, 2 negative covid test . Pt unsteady walking

## 2021-03-29 LAB — COVID-19, FLU A+B NAA
Influenza A, NAA: NOT DETECTED
Influenza B, NAA: NOT DETECTED
SARS-CoV-2, NAA: NOT DETECTED

## 2021-03-29 NOTE — ED Provider Notes (Signed)
[place]   [id number] 03/28/21 Arrival Time: [place]:  1. Fatigue, unspecified type   2. Body aches    Labs Reviewed  POCT URINALYSIS DIP (MANUAL ENTRY) - Abnormal; Notable for the following components:      Result Value   Bilirubin, UA small (*)    Ketones, POC UA trace (5) (*)    Blood, UA small (*)    Protein Ur, POC >=300 (*)    All other components within normal limits  POCT FASTING CBG KUC MANUAL ENTRY - Abnormal; Notable for the following components:   POCT Glucose (KUC) 117 (*)    All other components within normal limits  COVID-19, FLU A+B NAA   COVID-19 testing sent. OTC symptom care as needed. Discussed likely viral illness.    Follow-up Information     Asencion Noble, MD.   Specialty: Internal Medicine Why: If worsening or failing to improve as anticipated. Contact information: [address] [place] [address] [phone]               - and to recheck blood sugar.  Reviewed expectations re: course of current medical issues. Questions answered. Outlined signs and symptoms indicating need for more acute intervention. Understanding verbalized. After Visit Summary given.   SUBJECTIVE: History from: patient. Anna Hicks is a 80 y.o. female who reports subj fever, fatigue, overall feeling unwell; past sev days. Denies: difficulty breathing. Normal PO intake without n/v/d.   OBJECTIVE:  Vitals:   03/28/21 1614  BP: 119/80  Pulse: 100  Resp: 20  Temp: (!) 100.4 F (38 C)  SpO2: 93%    General appearance: alert; no distress Eyes: PERRLA; EOMI; conjunctiva normal HENT: [place]; AT; with nasal congestion Neck: supple  Lungs: speaks full sentences without difficulty; unlabored; CTAB Extremities: no edema Skin: warm and dry Neurologic: normal gait Psychological: alert and cooperative; normal mood and affect  Labs: Results for orders placed or performed during the hospital encounter of 03/28/21  POCT  urinalysis dipstick  Result Value Ref Range   Color, UA yellow yellow   Clarity, UA clear clear   Glucose, UA negative negative mg/dL   Bilirubin, UA small (A) negative   Ketones, POC UA trace (5) (A) negative mg/dL   Spec Grav, UA 1.025 1.010 - 1.025   Blood, UA small (A) negative   pH, UA 5.5 5.0 - 8.0   Protein Ur, POC >=300 (A) negative mg/dL   Urobilinogen, UA 1.0 0.2 or 1.0 E.U./dL   Nitrite, UA Negative Negative   Leukocytes, UA Negative Negative  POCT CBG (manual entry)  Result Value Ref Range   POCT Glucose (KUC) 117 (A) 70 - 99 mg/dL   Labs Reviewed  POCT URINALYSIS DIP (MANUAL ENTRY) - Abnormal; Notable for the following components:      Result Value   Bilirubin, UA small (*)    Ketones, POC UA trace (5) (*)    Blood, UA small (*)    Protein Ur, POC >=300 (*)    All other components within normal limits  POCT FASTING CBG KUC MANUAL ENTRY - Abnormal; Notable for the following components:   POCT Glucose (KUC) 117 (*)    All other components within normal limits  COVID-19, FLU A+B NAA    Allergies  Allergen Reactions   Penicillins Hives    Past Medical History:  Diagnosis Date   Arthritis    "minor" (10/20/2014)   Coronary artery disease    High cholesterol  Hypertension    Migraine    "visual" (10/20/2014)   Pneumonia ~ 1957   Social History   Socioeconomic History   Marital status: Married    Spouse name: Not on file   Number of children: Not on file   Years of education: Not on file   Highest education level: Not on file  Occupational History   Not on file  Tobacco Use   Smoking status: Never   Smokeless tobacco: Never  Substance and Sexual Activity   Alcohol use: No    Alcohol/week: 0.0 standard drinks   Drug use: No   Sexual activity: Yes    Partners: Male    Birth control/protection: Post-menopausal  Other Topics Concern   Not on file  Social History Narrative   Not on file   Social Determinants of Health   Financial Resource  Strain: Not on file  Food Insecurity: Not on file  Transportation Needs: Not on file  Physical Activity: Not on file  Stress: Not on file  Social Connections: Not on file  Intimate Partner Violence: Not on file   Family History  Problem Relation Age of Onset   Colon cancer Maternal Uncle    Cancer Father    Breast cancer Daughter    Past Surgical History:  Procedure Laterality Date   CARDIAC CATHETERIZATION  10/20/2014   COLONOSCOPY  09/25/2011   Procedure: COLONOSCOPY;  Surgeon: Daneil Dolin, MD;  [facility];  Service: Endoscopy;  Laterality: N/A;  12:15 pm   LEFT HEART CATHETERIZATION WITH CORONARY ANGIOGRAM N/A 10/20/2014   Procedure: LEFT HEART CATHETERIZATION WITH CORONARY ANGIOGRAM;  Surgeon: Burnell Blanks, MD;  Location: [facility] CATH LAB;  Service: Cardiovascular;  Laterality: N/A;   TONSILLECTOMY     TUBAL LIGATION       Vanessa Kick, MD 03/29/21 1109

## 2022-03-29 IMAGING — MG DIGITAL SCREENING BILAT W/ TOMO W/ CAD
8 series · 8 of 24 positions shown · non-contrast
Comparison: Previous exam(s).

CLINICAL DATA: Screening.

EXAM:
DIGITAL SCREENING BILATERAL MAMMOGRAM WITH TOMO AND CAD

[L CC synth-2D]
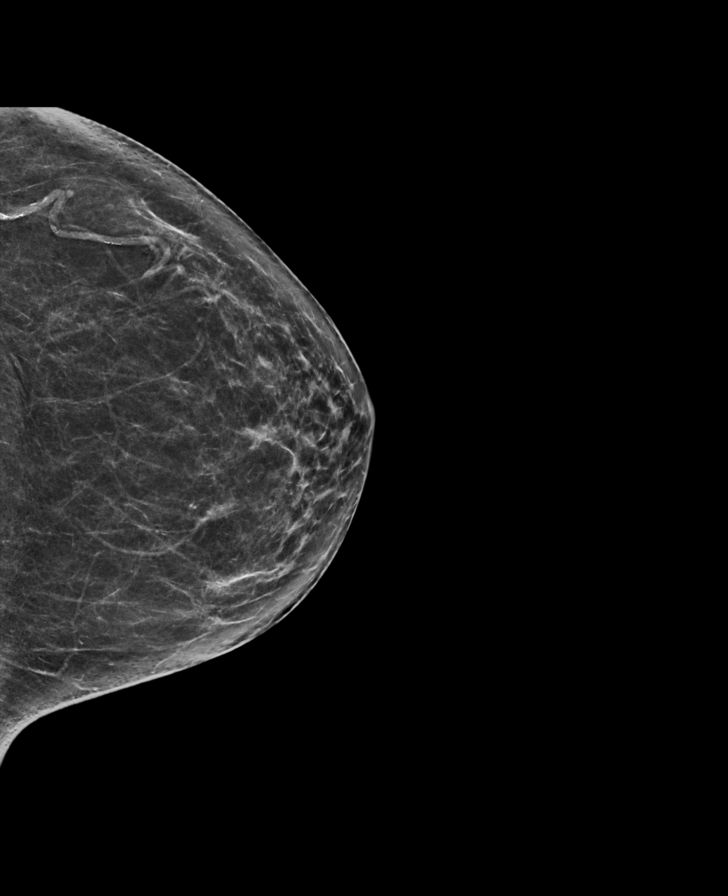

[L MLO synth-2D]
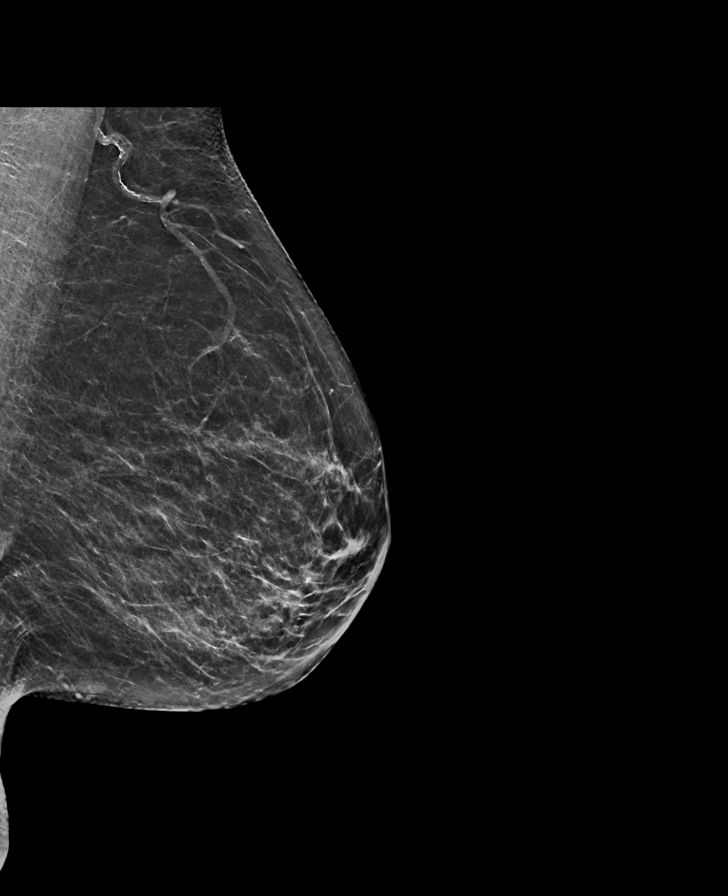

[R MLO synth-2D]
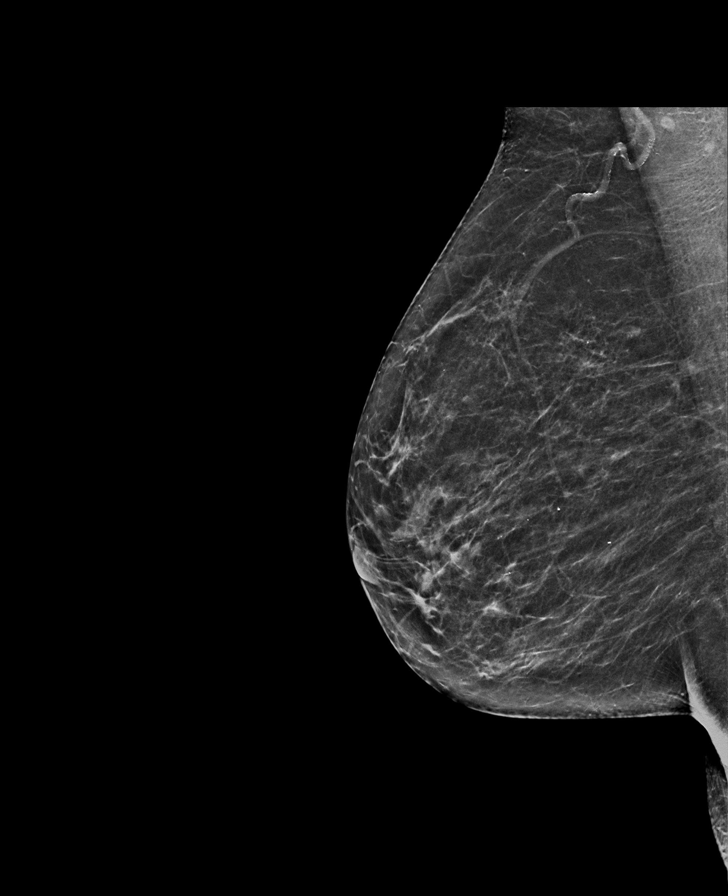

[R CC synth-2D]
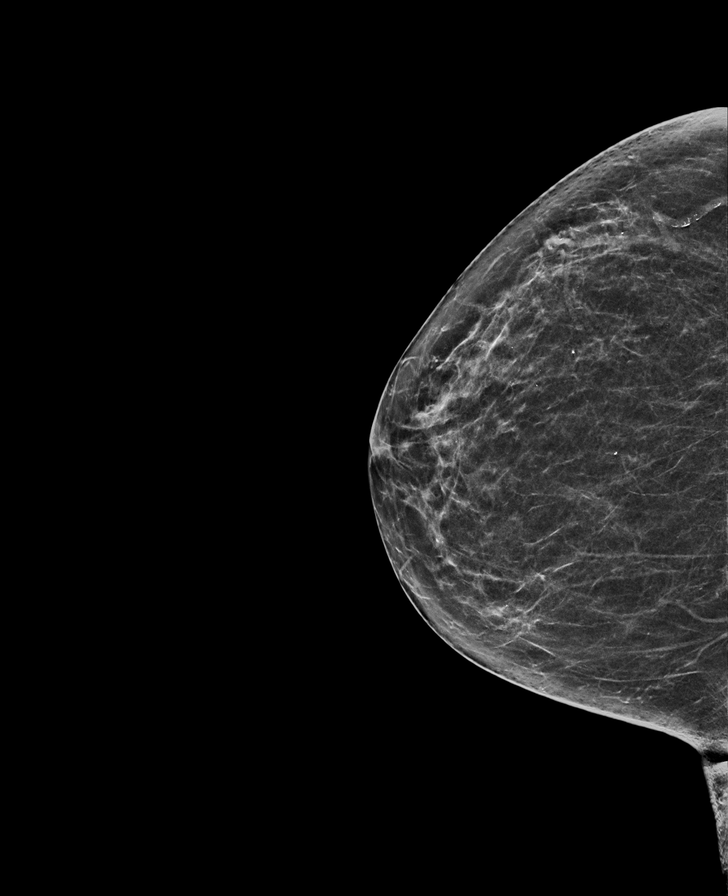

[R CC tomo · tomo slice 30/59.0]
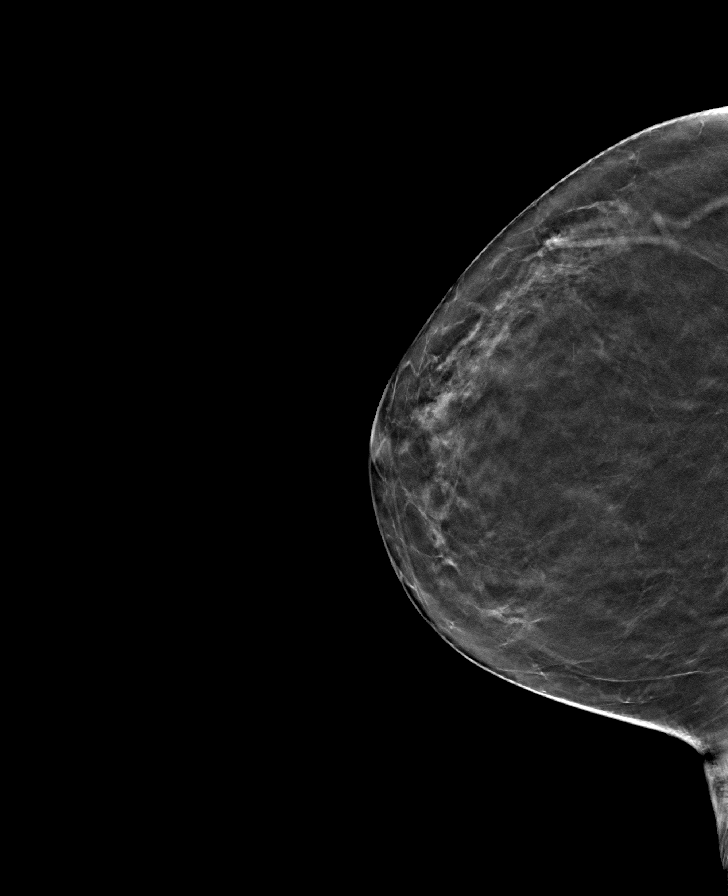

[L CC tomo · tomo slice 31/62.0]
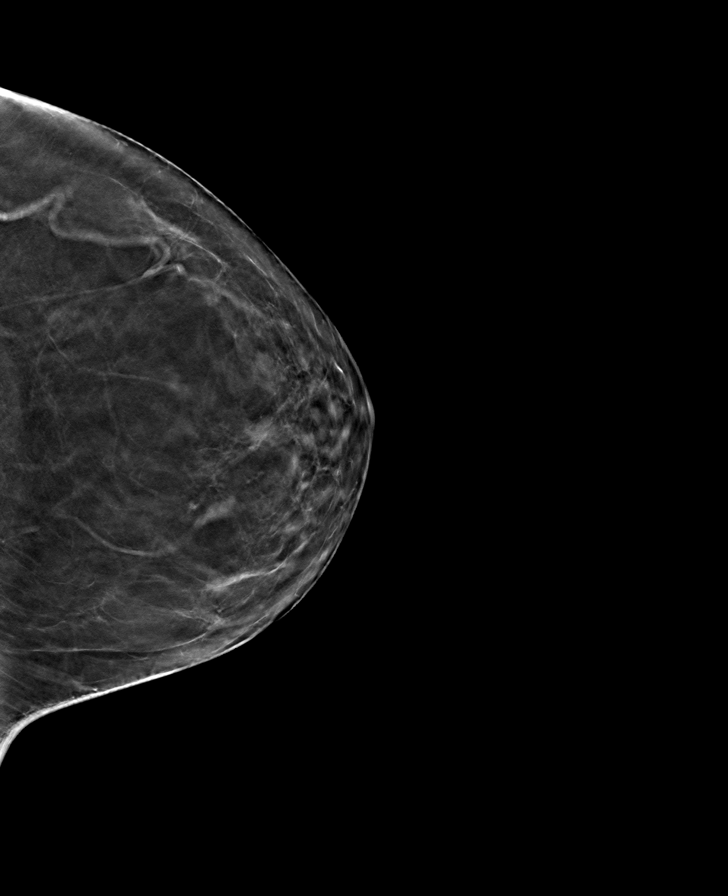

[L MLO tomo · tomo slice 31/62.0]
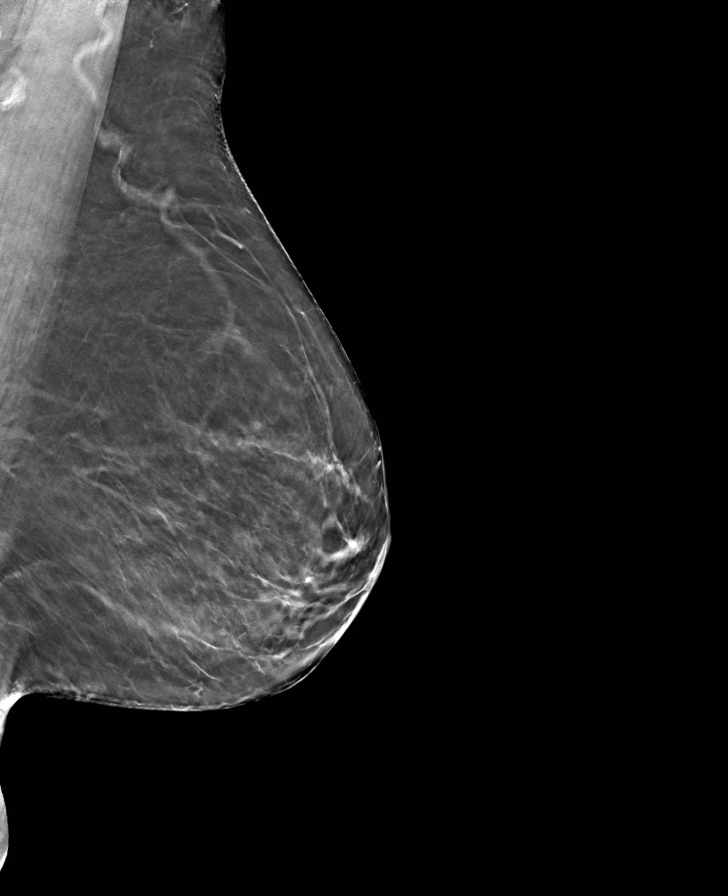

[R MLO tomo · tomo slice 31/61.0]
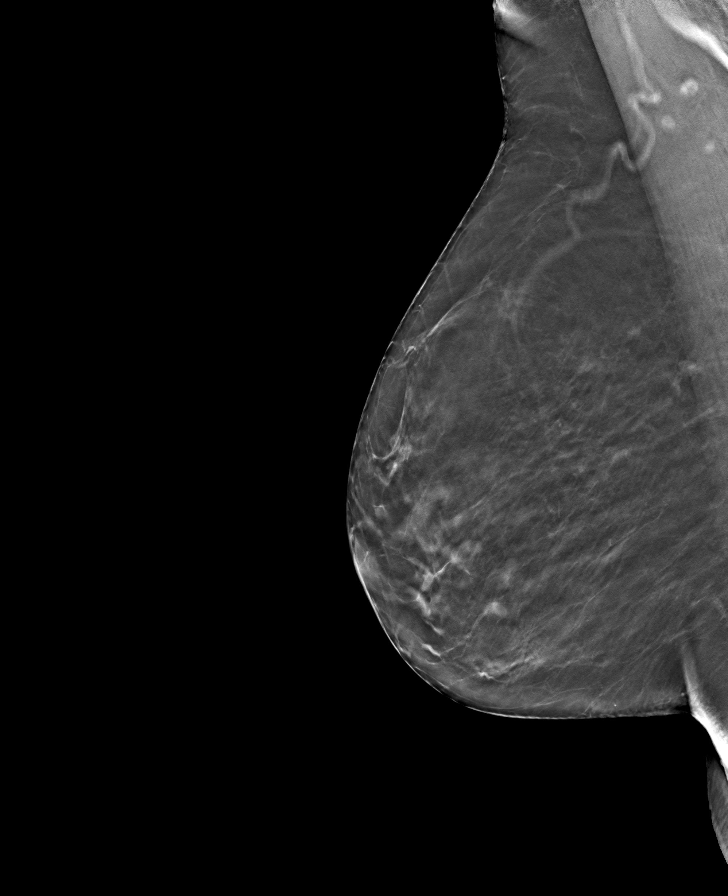

[8 of 24 positions shown; findings below may reference images not displayed]

ACR Breast Density Category b: There are scattered areas of
fibroglandular density.
FINDINGS: There are no findings suspicious for malignancy. Images were
processed with CAD.
IMPRESSION: No mammographic evidence of malignancy. A result letter of this
screening mammogram will be mailed directly to the patient.

RECOMMENDATION:
Screening mammogram in one year. (Code:CN-U-775)

BI-RADS CATEGORY  1: Negative.

## 2022-05-11 ENCOUNTER — Ambulatory Visit: Payer: BC Managed Care – PPO | Admitting: Cardiovascular Disease

## 2022-05-11 ENCOUNTER — Telehealth: Payer: Self-pay | Admitting: Cardiovascular Disease

## 2022-05-11 NOTE — Telephone Encounter (Signed)
Kathlee Nations from Dr. Ria Comment office says the doctor is requesting to speak with the DOD.

## 2022-05-11 NOTE — Telephone Encounter (Signed)
Dr. Sallyanne Kuster spoke with Dr. Willey Blade. Patient added to DOD today

## 2022-06-08 ENCOUNTER — Ambulatory Visit: Payer: Medicare HMO | Attending: Cardiology | Admitting: Cardiology

## 2022-06-08 ENCOUNTER — Encounter: Payer: Self-pay | Admitting: *Deleted

## 2022-06-08 ENCOUNTER — Encounter: Payer: Self-pay | Admitting: Cardiology

## 2022-06-08 VITALS — BP 136/90 | HR 76 | Ht 61.0 in | Wt 138.2 lb

## 2022-06-08 DIAGNOSIS — R0789 Other chest pain: Secondary | ICD-10-CM

## 2022-06-08 DIAGNOSIS — R079 Chest pain, unspecified: Secondary | ICD-10-CM | POA: Diagnosis not present

## 2022-06-08 DIAGNOSIS — Z01812 Encounter for preprocedural laboratory examination: Secondary | ICD-10-CM | POA: Diagnosis not present

## 2022-06-08 DIAGNOSIS — I2510 Atherosclerotic heart disease of native coronary artery without angina pectoris: Secondary | ICD-10-CM | POA: Diagnosis not present

## 2022-06-08 MED ORDER — METOPROLOL TARTRATE 100 MG PO TABS: 100.0000 mg | ORAL_TABLET | Freq: Once | ORAL | 0 refills | Status: AC

## 2022-06-08 MED ORDER — METOPROLOL SUCCINATE ER 25 MG PO TB24: 25.0000 mg | ORAL_TABLET | Freq: Every day | ORAL | Status: AC

## 2022-06-08 NOTE — Patient Instructions (Signed)
Medication Instructions:  Continue all current medications.    Labwork: BMET - order given today  Please do 2-3 days prior to CT   Testing/Procedures: Coronary CTA  Office will contact with results via phone, letter or mychart.     Follow-Up:  Follow up pending test results   Any Other Special Instructions Will Be Listed Below (If Applicable).   If you need a refill on your cardiac medications before your next appointment, please call your pharmacy.

## 2022-06-08 NOTE — Progress Notes (Signed)
Clinical Summary Anna Hicks is a 81 y.o.female last seen by Dr Bronson Ing in 2017, this is our first visit together. Seen as a new patient today for the following medical problems  1.CAD/Chest pain - Coronary angiography in 2016 demonstrated moderate disease in the LAD which was not flow limiting by fractional flow reserve assessment.She had moderately severe stenosis at the ostium at a moderate caliber diagonal Audree Schrecengost but this vessel arose from the diseased segment of the mid LAD. She had no obstructive disease in the RCA or circumflex with normal left ventricular systolic function. Recs medical therapy, if refractory could consider PTCA to diago   - recent chest pain - started about 6 months ago. Midchest, not able to describe type of pain. 7/10 in severity. Can occur at rest. No other associated symptoms. Longest lasted about 30 minutes. Not positional. Occurs randomly, no set pattern - some DOE with activities which is new, though remains very active      Past Medical History:  Diagnosis Date   Arthritis    "minor" (10/20/2014)   Coronary artery disease    High cholesterol    Hypertension    Migraine    "visual" (10/20/2014)   Pneumonia ~ 1957     Allergies  Allergen Reactions   Penicillins Hives     Current Outpatient Medications  Medication Sig Dispense Refill   aspirin EC 81 MG tablet Take 81 mg by mouth daily.     doxycycline (VIBRAMYCIN) 100 MG capsule Take 100 mg by mouth 2 (two) times daily.     ezetimibe (ZETIA) 10 MG tablet Take 1 tablet (10 mg total) by mouth daily. 90 tablet 3   isosorbide mononitrate (IMDUR) 30 MG 24 hr tablet Take 0.5 tablets (15 mg total) by mouth daily. 45 tablet 3   losartan (COZAAR) 25 MG tablet Take 25 mg by mouth daily.     metoprolol tartrate (LOPRESSOR) 25 MG tablet Take 0.5 tablets (12.5 mg total) by mouth 2 (two) times daily. 90 tablet 3   Polyethyl Glycol-Propyl Glycol (SYSTANE OP) Place 1 drop into both eyes daily as needed.      No current facility-administered medications for this visit.     Past Surgical History:  Procedure Laterality Date   CARDIAC CATHETERIZATION  10/20/2014   COLONOSCOPY  09/25/2011   Procedure: COLONOSCOPY;  Surgeon: Daneil Dolin, MD;  [facility];  Service: Endoscopy;  Laterality: N/A;  12:15 pm   LEFT HEART CATHETERIZATION WITH CORONARY ANGIOGRAM N/A 10/20/2014   Procedure: LEFT HEART CATHETERIZATION WITH CORONARY ANGIOGRAM;  Surgeon: Burnell Blanks, MD;  Location: [facility] CATH LAB;  Service: Cardiovascular;  Laterality: N/A;   TONSILLECTOMY     TUBAL LIGATION       Allergies  Allergen Reactions   Penicillins Hives      Family History  Problem Relation Age of Onset   Colon cancer Maternal Uncle    Cancer Father    Breast cancer Daughter      Social History Ms. Oborn reports that she has never smoked. She has never used smokeless tobacco. Ms. Centola reports no history of alcohol use.   Review of Systems CONSTITUTIONAL: No weight loss, fever, chills, weakness or fatigue.  HEENT: Eyes: No visual loss, blurred vision, double vision or yellow sclerae.No hearing loss, sneezing, congestion, runny nose or sore throat.  SKIN: No rash or itching.  CARDIOVASCULAR: per hpi RESPIRATORY: per hpi GASTROINTESTINAL: No anorexia, nausea, vomiting or diarrhea. No abdominal pain  or blood.  GENITOURINARY: No burning on urination, no polyuria NEUROLOGICAL: No headache, dizziness, syncope, paralysis, ataxia, numbness or tingling in the extremities. No change in bowel or bladder control.  MUSCULOSKELETAL: No muscle, back pain, joint pain or stiffness.  LYMPHATICS: No enlarged nodes. No history of splenectomy.  PSYCHIATRIC: No history of depression or anxiety.  ENDOCRINOLOGIC: No reports of sweating, cold or heat intolerance. No polyuria or polydipsia.  Marland Kitchen   Physical Examination Today's Vitals   06/08/22 1314  BP: (!) 136/90  Pulse: 76  SpO2: 98%  Weight: 138 lb 3.2 oz  (62.7 kg)  Height: '5\' 1"'$  (1.549 m)   Body mass index is 26.11 kg/m.  Gen: resting comfortably, no acute distress HEENT: no scleral icterus, pupils equal round and reactive, no palptable cervical adenopathy,  CV Resp: Clear to auscultation bilaterally GI: abdomen is soft, non-tender, non-distended, normal bowel sounds, no hepatosplenomegaly MSK: extremities are warm, no edema.  Skin: warm, no rash Neuro:  no focal deficits Psych: appropriate affect     Assessment and Plan  1.Chest pain/CAD - recent symptoms mixed in description as cardiac chest pain. Has had some recent DOE as well though still remains active - cath 2016 with bodrerline LAD disease, mod diag disease - obtain coronary CTA to further evaluate her symptoms - EKG from pcp shows NSR      Arnoldo Lenis, M.D.

## 2022-06-12 NOTE — Addendum Note (Signed)
Addended by: Laurine Blazer on: 06/12/2022 02:05 PM   Modules accepted: Orders

## 2022-06-25 ENCOUNTER — Other Ambulatory Visit (HOSPITAL_COMMUNITY)
Admission: RE | Admit: 2022-06-25 | Discharge: 2022-06-25 | Disposition: A | Discharge status: Home / Self Care | Payer: Medicare HMO | Source: Ambulatory Visit | Attending: Cardiology | Admitting: Cardiology

## 2022-06-25 DIAGNOSIS — Z01812 Encounter for preprocedural laboratory examination: Secondary | ICD-10-CM | POA: Insufficient documentation

## 2022-06-25 DIAGNOSIS — I2510 Atherosclerotic heart disease of native coronary artery without angina pectoris: Secondary | ICD-10-CM | POA: Diagnosis present

## 2022-06-25 DIAGNOSIS — R0789 Other chest pain: Secondary | ICD-10-CM | POA: Diagnosis present

## 2022-06-25 LAB — BASIC METABOLIC PANEL
Anion gap: 8 (ref 5–15)
BUN: 23 mg/dL (ref 8–23)
CO2: 27 mmol/L (ref 22–32)
Calcium: 9.2 mg/dL (ref 8.9–10.3)
Chloride: 103 mmol/L (ref 98–111)
Creatinine, Ser: 0.92 mg/dL (ref 0.44–1.00)
GFR, Estimated: >60 mL/min (ref >60)
Glucose, Bld: 109 mg/dL — ABNORMAL HIGH (ref 70–99)
Potassium: 4.3 mmol/L (ref 3.5–5.1)
Sodium: 138 mmol/L (ref 135–145)

## 2022-06-26 ENCOUNTER — Telehealth (HOSPITAL_COMMUNITY): Payer: Self-pay | Admitting: Emergency Medicine

## 2022-06-26 NOTE — Telephone Encounter (Signed)
Attempted to call patient regarding upcoming cardiac CT appointment. °Left message on voicemail with name and callback number °Raia Amico RN Navigator Cardiac Imaging °[facility] °[phone] Office °[phone] Cell ° °

## 2022-06-27 ENCOUNTER — Other Ambulatory Visit: Payer: Self-pay | Admitting: Cardiovascular Disease

## 2022-06-27 ENCOUNTER — Ambulatory Visit (HOSPITAL_COMMUNITY)
Admission: RE | Admit: 2022-06-27 | Discharge: 2022-06-27 | Disposition: A | Discharge status: Home / Self Care | Payer: Medicare HMO | Source: Ambulatory Visit | Attending: Cardiology | Admitting: Cardiology

## 2022-06-27 ENCOUNTER — Ambulatory Visit (HOSPITAL_BASED_OUTPATIENT_CLINIC_OR_DEPARTMENT_OTHER)
Admission: RE | Admit: 2022-06-27 | Discharge: 2022-06-27 | Disposition: A | Discharge status: Home / Self Care | Payer: Medicare HMO | Source: Ambulatory Visit | Attending: Cardiovascular Disease | Admitting: Cardiovascular Disease

## 2022-06-27 ENCOUNTER — Ambulatory Visit (HOSPITAL_COMMUNITY)
Admission: RE | Admit: 2022-06-27 | Discharge: 2022-06-27 | Disposition: A | Discharge status: Home / Self Care | Payer: Medicare HMO | Source: Ambulatory Visit | Attending: Cardiovascular Disease | Admitting: Cardiovascular Disease

## 2022-06-27 DIAGNOSIS — I2510 Atherosclerotic heart disease of native coronary artery without angina pectoris: Secondary | ICD-10-CM

## 2022-06-27 DIAGNOSIS — R931 Abnormal findings on diagnostic imaging of heart and coronary circulation: Secondary | ICD-10-CM | POA: Diagnosis not present

## 2022-06-27 DIAGNOSIS — R079 Chest pain, unspecified: Secondary | ICD-10-CM | POA: Insufficient documentation

## 2022-06-27 MED ORDER — NITROGLYCERIN 0.4 MG SL SUBL
0.8000 mg | SUBLINGUAL_TABLET | Freq: Once | SUBLINGUAL | Status: AC
  Administered 2022-06-27: 0.8 mg via SUBLINGUAL

## 2022-06-27 MED ORDER — NITROGLYCERIN 0.4 MG SL SUBL
SUBLINGUAL_TABLET | SUBLINGUAL | Status: AC
  Filled 2022-06-27: qty 2

## 2022-06-27 MED ORDER — IOHEXOL 350 MG/ML SOLN
95.0000 mL | Freq: Once | INTRAVENOUS | Status: AC | PRN
  Administered 2022-06-27: 95 mL via INTRAVENOUS

## 2022-07-02 ENCOUNTER — Telehealth: Payer: Self-pay | Admitting: Cardiology

## 2022-07-02 NOTE — Telephone Encounter (Signed)
Patient is returning call from 10/27 regarding her CT results. Please advise.

## 2022-07-03 NOTE — Telephone Encounter (Signed)
Patient calling back.   °

## 2022-07-03 NOTE — Telephone Encounter (Signed)
Left message to return call 

## 2022-07-04 NOTE — Telephone Encounter (Signed)
Addressed yesterday - see result note.

## 2022-12-06 ENCOUNTER — Ambulatory Visit: Payer: Medicare HMO | Admitting: Cardiology

## 2023-12-04 DIAGNOSIS — R32 Unspecified urinary incontinence: Secondary | ICD-10-CM | POA: Insufficient documentation

## 2023-12-04 DIAGNOSIS — M8580 Other specified disorders of bone density and structure, unspecified site: Secondary | ICD-10-CM | POA: Insufficient documentation

## 2023-12-09 ENCOUNTER — Other Ambulatory Visit (INDEPENDENT_AMBULATORY_CARE_PROVIDER_SITE_OTHER): Payer: Self-pay

## 2023-12-09 ENCOUNTER — Ambulatory Visit (INDEPENDENT_AMBULATORY_CARE_PROVIDER_SITE_OTHER): Admitting: Orthopedic Surgery

## 2023-12-09 VITALS — BP 135/86 | HR 66 | Ht 61.0 in | Wt 135.0 lb

## 2023-12-09 DIAGNOSIS — M545 Low back pain, unspecified: Secondary | ICD-10-CM

## 2023-12-09 NOTE — Progress Notes (Signed)
  Intake history:  BP 135/86   Pulse 66   Ht 5\' 1"  (1.549 m)   Wt 135 lb (61.2 kg)   BMI 25.51 kg/m  Body mass index is 25.51 kg/m.    WHAT ARE WE SEEING YOU FOR TODAY?   back leftt lower back  Radiation?: yes - left leg .   Loss of bowel/urine control?  No   How long has this bothered you? (DOI?DOS?WS?)  approximately severa years(s) ago Pain comes and goes but more severe with this episode of pain, at times has to walk with cane, [place] has been resting which helps with pain.  Anticoag.  No  Diabetes No  Heart disease Yes  Hypertension Yes  SMOKING HX No  Kidney disease No  Any ALLERGIES ______________________________________________   Treatment:  Have you taken:  Tylenol Yes  Advil No  Had PT No  Had injection No  Other  _________________________

## 2023-12-09 NOTE — Patient Instructions (Signed)
 Physical therapy has been ordered for you at [facility]. They should call you to schedule, [phone] is the phone number to call, if you want to call to schedule.

## 2023-12-09 NOTE — Progress Notes (Signed)
 Patient ID: Anna Hicks, female   DOB: 1941/03/29, 83 y.o.   MRN: [id number]  Chief Complaint  Patient presents with   Back Pain   Assessment and plan  83 year old female with spondylosis and scoliosis of the lumbar spine; She does have some intermittent sciatica type symptoms.  I do not recommend surgery I recommend physical therapy.  Reviewed the pathology with the patient including pictures and imaging.  History of present illness this is an 83 year old female presents to [place] with a complaint of left hip pain.  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  She is anxious to find out what is causing her symptoms  She does not have any bowel or bladder dysfunction at this time no history of cancer  Past Medical History:  Diagnosis Date   Arthritis    "minor" (10/20/2014)   Coronary artery disease    High cholesterol    Hypertension    Migraine    "visual" (10/20/2014)   Pneumonia ~ 1957   BP 135/86   Pulse 66   Ht 5\' 1"  (1.549 m)   Wt 135 lb (61.2 kg)   BMI 25.51 kg/m   Appearance is normal body habitus ectomorphic no for deformities development nutrition looks good  Pulse and perfusion look normal mild varicosities noted in the lower extremities gait and station normal.  She is tender in her lower back right at the belt line also in the left buttock.  She has normal range of motion in her left and right hip.  She has a negative straight leg raise for reproduction of radicular symptoms.  Muscle strength and muscle tone L3-S1 normal bilaterally with normal pinwheel prick test bilaterally normal reflexes bilaterally.  Skin warm dry intact both lower extremities down to the feet.  She is awake alert and  oriented x 3 mood and affect are normal  DG Lumbar Spine 2-3 Views Result Date: 12/09/2023 Spinal imaging back pain with left leg radicular symptoms Sagittal x-ray shows narrowing at L5-S1 spondylolisthesis L4-5 narrowing at L1-2 and L2-3, facet arthritis as well, scoliosis in the lumbar spine as well. Impression Spondylosis Lumbar scoliosis mild Grade 1 spondylolisthesis L5-S1 Degenerative disc disease L1-2, L2-3, L4-5.

## 2023-12-10 ENCOUNTER — Ambulatory Visit (HOSPITAL_COMMUNITY): Attending: Orthopedic Surgery

## 2023-12-10 ENCOUNTER — Encounter (HOSPITAL_COMMUNITY): Payer: Self-pay

## 2023-12-10 ENCOUNTER — Other Ambulatory Visit: Payer: Self-pay

## 2023-12-10 DIAGNOSIS — M545 Low back pain, unspecified: Secondary | ICD-10-CM | POA: Diagnosis not present

## 2023-12-10 DIAGNOSIS — M5442 Lumbago with sciatica, left side: Secondary | ICD-10-CM | POA: Diagnosis present

## 2023-12-10 NOTE — Therapy (Signed)
 OUTPATIENT PHYSICAL THERAPY THORACOLUMBAR EVALUATION   Patient Name: Anna Hicks MRN: [id number] DOB:07/05/1941, 83 y.o., female Today's Date: 12/10/2023  END OF SESSION:  PT End of Session - 12/10/23 1011     Visit Number 1    Number of Visits 12    Date for PT Re-Evaluation 01/23/24    Authorization Type [employer] HMO/PPO    PT Start Time 0930    PT Stop Time 1013    PT Time Calculation (min) 43 min    Activity Tolerance Patient tolerated treatment well    Behavior During Therapy WFL for tasks assessed/performed             Past Medical History:  Diagnosis Date   Arthritis    "minor" (10/20/2014)   Coronary artery disease    High cholesterol    Hypertension    Migraine    "visual" (10/20/2014)   Pneumonia ~ 1957   Past Surgical History:  Procedure Laterality Date   CARDIAC CATHETERIZATION  10/20/2014   COLONOSCOPY  09/25/2011   Procedure: COLONOSCOPY;  Surgeon: Corbin Ade, MD;  [facility];  Service: Endoscopy;  Laterality: N/A;  12:15 pm   LEFT HEART CATHETERIZATION WITH CORONARY ANGIOGRAM N/A 10/20/2014   Procedure: LEFT HEART CATHETERIZATION WITH CORONARY ANGIOGRAM;  Surgeon: Kathleene Hazel, MD;  Location: [facility] CATH LAB;  Service: Cardiovascular;  Laterality: N/A;   TONSILLECTOMY     TUBAL LIGATION     Patient Active Problem List   Diagnosis Date Noted   Osteopenia 12/04/2023   Urinary incontinence 12/04/2023   DOE (dyspnea on exertion) 10/21/2014   Abnormal cardiac function test 10/21/2014   Dyslipidemia-statin intol 10/21/2014   PAT (paroxysmal atrial tachycardia) (HCC) 10/21/2014   Coronary artery disease due to lipid rich plaque    Expressive aphasia post cath-felt to be secondary to mirgaine 10/20/2014   Essential hypertension 10/20/2014   Cerebral infarction (HCC)     PCP: Carylon Perches MD  REFERRING PROVIDER: Vickki Hearing, MD  REFERRING DIAG: M54.50 (ICD-10-CM) - Lumbar pain   Rationale for Evaluation and  Treatment: Rehabilitation  THERAPY DIAG:  Left-sided low back pain with left-sided sciatica, unspecified chronicity  ONSET DATE: over last 4 years  SUBJECTIVE:                                                                                                                                                                                           SUBJECTIVE STATEMENT: Pt reports she has been having more pain in the L hip down the back of her hip intermittently that impacts her balance and she has to use  a cane at night sometimes because of it.   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 Yes: NPRS scale: 5/10 Pain location: in the L hip Pain description: aching and spreads across to the buttocks and down the back of the leg sometimes to the foot Aggravating factors: standing or sitting for long periods Relieving factors: resting and sometimes moving around, laying out in her bed  PRECAUTIONS: Fall  RED FLAGS: None   WEIGHT BEARING RESTRICTIONS: No  FALLS:  Has patient fallen in last 6 months? No  LIVING ENVIRONMENT: Lives with: lives with their family and on a farm Lives in: House/apartment Stairs: Yes: Internal: 1 steps; none and External: 0 steps; none Has following equipment at home: Single point cane  OCCUPATION: Retired   PLOF: Independent with basic ADLs and Independent with transfers  PATIENT GOALS: "Would like to get so it doesn't cripple me in the evenings. About suppertime I get to the point to where I can't move. I get these episodes but they don't last and the next day I'm better. When I have to do a lot of bending and moving it gets me"  NEXT MD VISIT:  TBD  OBJECTIVE:  Note: Objective measures were completed at Evaluation unless otherwise noted.  PATIENT SURVEYS:  Modified Oswestry 18/50 - 36%   COGNITION: Overall cognitive status: Within functional limits for tasks assessed     SENSATION: WFL  MUSCLE LENGTH: Hamstrings: Right 35 deg; Left 40 deg  POSTURE: forward head and increased lumbar lordosis  PALPATION: L gluteal crest and hip flexor slight tenderness; paraspinal bilateral TTP slightly  LUMBAR ROM:   AROM eval  Flexion 85%  Extension 70%  Right lateral flexion Increased L pain in posterior hip - 75%  Left lateral flexion WFL  Right rotation   Left rotation    (Blank rows = not tested)  LOWER EXTREMITY ROM:     Passive  Right eval Left eval  Hip flexion    Hip extension    Hip abduction    Hip adduction    Hip internal rotation ~25 ~35  Hip external rotation ~65 ~55  Knee flexion    Knee extension    Ankle dorsiflexion    Ankle plantarflexion    Ankle inversion    Ankle eversion     (Blank rows = not tested)  LOWER EXTREMITY MMT:    MMT Right eval Left eval  Hip flexion 4-/5 3+/5  Hip extension    Hip abduction 3+/5 3+/5  Hip adduction    Hip internal rotation    Hip external rotation    Knee flexion 4/5 3+/5  Knee extension 4/5 4-/5  Ankle dorsiflexion 4/5 4-/5  Ankle plantarflexion 4/5 4-/5  Ankle inversion    Ankle eversion     (Blank rows = not tested)  LUMBAR SPECIAL TESTS:  Straight leg raise test: Negative, Slump test: Positive, and FABER test: Negative  FUNCTIONAL TESTS:  5 times sit to stand: 23s 30 seconds chair stand test 6 reps without use of hands  GAIT: Distance walked: nt Assistive device utilized: None Level of assistance: Complete Independence Comments: trendelenburg, decreased foot clearance, decreased thoracic rotation  TREATMENT DATE:  12/10/23 Eval + HEP performance and demonstration of PPT w/ engagement and hold                                                                                                                                PATIENT EDUCATION:  Education details: HEP, progressive mobility  Person educated: Patient Education method: Medical illustrator Education comprehension: verbalized understanding  HOME EXERCISE PROGRAM: Access Code: [email or web address] URL: [email or web address] Date: 12/10/2023 Prepared by: Bettey Mare  Exercises - Supine Posterior Pelvic Tilt  - 1 x daily - 7 x weekly - 2 sets - 10 reps - 10s hold - Supine Lower Trunk Rotation  - 1 x daily - 7 x weekly - 2 sets - 10 reps - Supine Piriformis Stretch with Leg Straight  - 1 x daily - 7 x weekly - 2 sets - 4 reps - 15s hold - Clamshell  - 1 x daily - 7 x weekly - 3 sets - 10 reps - Seated Slump Nerve Glide  - 1 x daily - 7 x weekly - 3 sets - 10 reps  ASSESSMENT:  CLINICAL IMPRESSION: Patient is a 83 y.o. female who was seen today for physical therapy evaluation and treatment for referral of low back pain presenting with sxs consistent of lumbar dysfunction impacting L posterior buttock and L LE weakness as compared to R. Demonstrates decreased balance reactions in standing with transfers and gait changes requiring supervision as well as decreased functional core engagement during transfers and gait. Recommend continued skilled physical therapy for improving gait mechanics, functional lumbo pelvic activation and strength and for improving tolerance to transfers / routine ADL performance.   OBJECTIVE IMPAIRMENTS: Abnormal gait, decreased activity tolerance, decreased balance, decreased endurance, decreased mobility, difficulty walking, decreased strength, impaired flexibility, improper body mechanics, postural dysfunction, and pain.   ACTIVITY LIMITATIONS: carrying, bending, sitting, standing, squatting, transfers, and functional activity at end of the day  PARTICIPATION LIMITATIONS: meal prep, cleaning, community activity, and yard  work  PERSONAL FACTORS: Age and 1-2 comorbidities: CAD, HTN  are also affecting patient's functional outcome.   REHAB POTENTIAL: Good  CLINICAL DECISION MAKING: Evolving/moderate complexity  EVALUATION COMPLEXITY: Moderate   GOALS: Goals reviewed with patient? Yes  SHORT TERM GOALS: Target date: 12/24/23  Pt will be independent with HEP with compliance for at least 75% of the week.  Baseline: prescribed HEP Goal status: INITIAL  2.  Pt will be able to perform slump test with negative for increase in sxs down L LE to indicate improved neural mobility to progress with strengthening and mobility interventions.  Baseline: + slump test left Goal status: INITIAL  LONG TERM GOALS: Target date: 01/24/24  Pt will be able to perform FTSTS in <18 s without UE use or increase in pain for demonstrating improved functional strength and endurance.  Baseline: see objective Goal status: INITIAL  2.  Pt will score at <10% disability on ODI for  indicating improved functional tolerance to ADL and improved QOL. Baseline:  Goal status: INITIAL  3.  Pt will report <1/10 pain at end of day for indicating improved functional postural and transfer mechanics appropriately transferring load and reducing load on lumbar spine.  Baseline: see objective Goal status: INITIAL   PLAN:  PT FREQUENCY: 2x/week  PT DURATION: 6 weeks  PLANNED INTERVENTIONS: 97110-Therapeutic exercises, 97530- Therapeutic activity, 97112- Neuromuscular re-education, 97535- Self Care, [id number]- Manual therapy, [phone]- Gait training, [phone]- Electrical stimulation (unattended), [phone]- Electrical stimulation (manual), Patient/Family education, Balance training, Stair training, Taping, Dry Needling, Joint mobilization, Joint manipulation, and Spinal mobilization.  PLAN FOR NEXT SESSION: Continue HEP and progress strengthening/core stabilization interventions; formal assessment of balance  Placido Sou PT, DPT [facility]  [facility]- [place] [phone] office  Placido Sou, PT 12/10/2023, 10:41 AM

## 2023-12-13 ENCOUNTER — Encounter (HOSPITAL_COMMUNITY): Payer: Self-pay

## 2023-12-13 ENCOUNTER — Ambulatory Visit (HOSPITAL_COMMUNITY)

## 2023-12-13 DIAGNOSIS — M5442 Lumbago with sciatica, left side: Secondary | ICD-10-CM

## 2023-12-13 NOTE — Therapy (Signed)
 OUTPATIENT PHYSICAL THERAPY THORACOLUMBAR TREATMENT   Patient Name: Anna Hicks MRN: [id number] DOB:21-Jun-1941, 83 y.o., female Today's Date: 12/13/2023  END OF SESSION:  PT End of Session - 12/13/23 1249     Visit Number 2    Number of Visits 12    Date for PT Re-Evaluation 01/23/24    Authorization Type [employer] Medicare HMO/PPO    PT Start Time 1016    PT Stop Time 1058    PT Time Calculation (min) 42 min    Activity Tolerance Patient tolerated treatment well    Behavior During Therapy WFL for tasks assessed/performed              Past Medical History:  Diagnosis Date   Arthritis    "minor" (10/20/2014)   Coronary artery disease    High cholesterol    Hypertension    Migraine    "visual" (10/20/2014)   Pneumonia ~ 1957   Past Surgical History:  Procedure Laterality Date   CARDIAC CATHETERIZATION  10/20/2014   COLONOSCOPY  09/25/2011   Procedure: COLONOSCOPY;  Surgeon: Corbin Ade, MD;  Location: [facility];  Service: Endoscopy;  Laterality: N/A;  12:15 pm   LEFT HEART CATHETERIZATION WITH CORONARY ANGIOGRAM N/A 10/20/2014   Procedure: LEFT HEART CATHETERIZATION WITH CORONARY ANGIOGRAM;  Surgeon: Kathleene Hazel, MD;  Location: [facility] CATH LAB;  Service: Cardiovascular;  Laterality: N/A;   TONSILLECTOMY     TUBAL LIGATION     Patient Active Problem List   Diagnosis Date Noted   Osteopenia 12/04/2023   Urinary incontinence 12/04/2023   DOE (dyspnea on exertion) 10/21/2014   Abnormal cardiac function test 10/21/2014   Dyslipidemia-statin intol 10/21/2014   PAT (paroxysmal atrial tachycardia) (HCC) 10/21/2014   Coronary artery disease due to lipid rich plaque    Expressive aphasia post cath-felt to be secondary to mirgaine 10/20/2014   Essential hypertension 10/20/2014   Cerebral infarction [facility])     PCP: Carylon Perches MD  REFERRING PROVIDER: Vickki Hearing, MD  REFERRING DIAG: M54.50 (ICD-10-CM) - Lumbar pain   Rationale for Evaluation and  Treatment: Rehabilitation  THERAPY DIAG:  Left-sided low back pain with left-sided sciatica, unspecified chronicity  ONSET DATE: over last 4 years  SUBJECTIVE:                                                                                                                                                                                           SUBJECTIVE STATEMENT: Patient reports pain rating of 2/10 upon presentation. Patient reports compliance with HEP, able to complete 7/7 days a week.  Pt reports she has been having  more pain in the L hip down the back of her hip intermittently that impacts her balance and she has to use a cane at night sometimes because of it.   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 Yes: NPRS scale: 5/10 Pain location: in the L hip Pain description: aching and spreads across to the buttocks and down the back of the leg sometimes to the foot Aggravating factors: standing or sitting for long periods Relieving factors: resting and sometimes moving around, laying out in her bed  PRECAUTIONS: Fall  RED FLAGS: None   WEIGHT BEARING RESTRICTIONS: No  FALLS:  Has patient fallen in last 6 months? No  LIVING ENVIRONMENT: Lives with: lives with their family and on a farm Lives in: House/apartment Stairs: Yes: Internal: 1 steps; none and External: 0 steps; none Has following equipment at home: Single point cane  OCCUPATION: Retired   PLOF: Independent with basic ADLs and Independent with transfers  PATIENT GOALS: "Would like to get so it doesn't cripple me in the evenings. About suppertime I get to the point to where I can't move. I get these  episodes but they don't last and the next day I'm better. When I have to do a lot of bending and moving it gets me"  NEXT MD VISIT: TBD  OBJECTIVE:  Note: Objective measures were completed at Evaluation unless otherwise noted.  PATIENT SURVEYS:  Modified Oswestry 18/50 - 36%   COGNITION: Overall cognitive status: Within functional limits for tasks assessed     SENSATION: WFL  MUSCLE LENGTH: Hamstrings: Right 35 deg; Left 40 deg  POSTURE: forward head and increased lumbar lordosis  PALPATION: L gluteal crest and hip flexor slight tenderness; paraspinal bilateral TTP slightly  LUMBAR ROM:   AROM eval  Flexion 85%  Extension 70%  Right lateral flexion Increased L pain in posterior hip - 75%  Left lateral flexion WFL  Right rotation   Left rotation    (Blank rows = not tested)  LOWER EXTREMITY ROM:     Passive  Right eval Left eval  Hip flexion    Hip extension    Hip abduction    Hip adduction    Hip internal rotation ~25 ~35  Hip external rotation ~65 ~55  Knee flexion    Knee extension    Ankle dorsiflexion    Ankle plantarflexion    Ankle inversion    Ankle eversion     (Blank rows = not tested)  LOWER EXTREMITY MMT:    MMT Right eval Left eval  Hip flexion 4-/5 3+/5  Hip extension    Hip abduction 3+/5 3+/5  Hip adduction    Hip internal rotation    Hip external rotation    Knee flexion 4/5 3+/5  Knee extension 4/5 4-/5  Ankle dorsiflexion 4/5 4-/5  Ankle plantarflexion 4/5 4-/5  Ankle inversion    Ankle eversion     (Blank rows = not tested)  LUMBAR SPECIAL TESTS:  Straight leg raise test: Negative, Slump test: Positive, and FABER test: Negative  FUNCTIONAL TESTS:  5 times sit to stand: 23s 30 seconds chair stand test 6 reps without use of hands SLS:  L: 7s  R: 23.61s  GAIT: Distance walked: nt Assistive device utilized: None Level of assistance: Complete Independence Comments: trendelenburg, decreased foot clearance, decreased  thoracic rotation  TREATMENT DATE:  12/13/2023  Therapeutic Exercise: -Supine bridges 2 sets of 10 reps, 3 second holds, symptomatic left side but bearable, pt cued for max hip extension -PPT 1 set of 10 reps, 3 second holds, pr cued to remain in pain free ROM -LTR 1 set of 10 reps bilaterally, pt cued to remain in pain free ROM -Lateral stepping 3 laps 20 feet per lap, with RTB around ankles, pt cued for upright posture -Forward lunges, 2 set of 5 reps better performance going into RLE, pt cued for core activation and upright posture, second set on bosu ball, bilateral UE support utilized Neuromuscular Re-education: -Standing balance narrow BOS, 30 seconds -Standing tandem stance(bilaterally), 30 seconds, increased wobble noted -SLS, left side decreased ability noted -Sit to stands, 2 sets of 8 reps, pt cued for core activation, second set with tidal wave column, pt demonstrates slight wobble   12/10/23 Eval + HEP performance and demonstration of PPT w/ engagement and hold                                                                                                                               PATIENT EDUCATION:  Education details: HEP, progressive mobility  Person educated: Patient Education method: Explanation and Demonstration Education comprehension: verbalized understanding  HOME EXERCISE PROGRAM: Access Code: [email or web address] URL: [email or web address] Date: 12/10/2023 Prepared by: Bettey Mare  Exercises - Supine Posterior Pelvic Tilt  - 1 x daily - 7 x weekly - 2 sets - 10 reps - 10s hold - Supine Lower Trunk Rotation  - 1 x daily - 7 x weekly - 2 sets - 10 reps - Supine Piriformis Stretch with Leg Straight  - 1 x daily - 7 x weekly - 2 sets - 4 reps - 15s hold - Clamshell  - 1 x daily - 7 x weekly - 3 sets - 10 reps - Seated Slump Nerve Glide  - 1 x daily - 7 x weekly - 3 sets - 10 reps  ASSESSMENT:  CLINICAL IMPRESSION: Patient continues to demonstrate  decreased LLE strength, decreased gait quality and balance. Patient also demonstrates decreased SLS ability on LLE compared to the RLE today. Patient able to progress dynamic balance and core activation exercises today with banded walks, good performance with verbal cueing. Patient would continue to benefit from skilled physical therapy for increased endurance with ambulation, increased LE strength, decreased LBP, and improved balance for improved quality of life, improved gait quality and continued progress towards therapy goals.  Patient is a 83 y.o. female who was seen today for physical therapy evaluation and treatment for referral of low back pain presenting with sxs consistent of lumbar dysfunction impacting L posterior buttock and L LE weakness as compared to R. Demonstrates decreased balance reactions in standing with transfers  and gait changes requiring supervision as well as decreased functional core engagement during transfers and gait. Recommend continued skilled physical therapy for improving gait mechanics, functional lumbo pelvic activation and strength and for improving tolerance to transfers / routine ADL performance.   OBJECTIVE IMPAIRMENTS: Abnormal gait, decreased activity tolerance, decreased balance, decreased endurance, decreased mobility, difficulty walking, decreased strength, impaired flexibility, improper body mechanics, postural dysfunction, and pain.   ACTIVITY LIMITATIONS: carrying, bending, sitting, standing, squatting, transfers, and functional activity at end of the day  PARTICIPATION LIMITATIONS: meal prep, cleaning, community activity, and yard work  PERSONAL FACTORS: Age and 1-2 comorbidities: CAD, HTN  are also affecting patient's functional outcome.   REHAB POTENTIAL: Good  CLINICAL DECISION MAKING: Evolving/moderate complexity  EVALUATION COMPLEXITY: Moderate   GOALS: Goals reviewed with patient? Yes  SHORT TERM GOALS: Target date: 12/24/23  Pt will be  independent with HEP with compliance for at least 75% of the week.  Baseline: prescribed HEP Goal status: INITIAL  2.  Pt will be able to perform slump test with negative for increase in sxs down L LE to indicate improved neural mobility to progress with strengthening and mobility interventions.  Baseline: + slump test left Goal status: INITIAL  LONG TERM GOALS: Target date: 01/24/24  Pt will be able to perform FTSTS in <18 s without UE use or increase in pain for demonstrating improved functional strength and endurance.  Baseline: see objective Goal status: INITIAL  2.  Pt will score at <10% disability on ODI for indicating improved functional tolerance to ADL and improved QOL. Baseline:  Goal status: INITIAL  3.  Pt will report <1/10 pain at end of day for indicating improved functional postural and transfer mechanics appropriately transferring load and reducing load on lumbar spine.  Baseline: see objective Goal status: INITIAL   PLAN:  PT FREQUENCY: 2x/week  PT DURATION: 6 weeks  PLANNED INTERVENTIONS: 97110-Therapeutic exercises, 97530- Therapeutic activity, 97112- Neuromuscular re-education, 97535- Self Care, [id number]- Manual therapy, [phone]- Gait training, [phone]- Electrical stimulation (unattended), [phone]- Electrical stimulation (manual), Patient/Family education, Balance training, Stair training, Taping, Dry Needling, Joint mobilization, Joint manipulation, and Spinal mobilization.  PLAN FOR NEXT SESSION: Continue HEP and progress strengthening/core stabilization interventions; formal assessment of balance  Luz Lex, PT, DPT [facility] Office: [phone] 2:08 PM, 12/13/23

## 2023-12-18 ENCOUNTER — Ambulatory Visit (HOSPITAL_COMMUNITY): Admitting: Physical Therapy

## 2023-12-18 DIAGNOSIS — M5442 Lumbago with sciatica, left side: Secondary | ICD-10-CM

## 2023-12-18 NOTE — Therapy (Signed)
 OUTPATIENT PHYSICAL THERAPY THORACOLUMBAR TREATMENT   Patient Name: Anna Hicks MRN: [id number] DOB:27-Nov-1940, 83 y.o., female Today's Date: 12/18/2023  END OF SESSION:  PT End of Session - 12/18/23 1057     Visit Number 3    Number of Visits 12    Date for PT Re-Evaluation 01/23/24    Authorization Type [employer] HMO/PPO    PT Start Time 0802    PT Stop Time 0845    PT Time Calculation (min) 43 min    Activity Tolerance Patient tolerated treatment well    Behavior During Therapy [facility] for tasks assessed/performed               Past Medical History:  Diagnosis Date   Arthritis    "minor" (10/20/2014)   Coronary artery disease    High cholesterol    Hypertension    Migraine    "visual" (10/20/2014)   Pneumonia ~ 1957   Past Surgical History:  Procedure Laterality Date   CARDIAC CATHETERIZATION  10/20/2014   COLONOSCOPY  09/25/2011   Procedure: COLONOSCOPY;  Surgeon: Suzette Espy, MD;  Location: [facility];  Service: Endoscopy;  Laterality: N/A;  12:15 pm   LEFT HEART CATHETERIZATION WITH CORONARY ANGIOGRAM N/A 10/20/2014   Procedure: LEFT HEART CATHETERIZATION WITH CORONARY ANGIOGRAM;  Surgeon: Odie Benne, MD;  Location: [facility] CATH LAB;  Service: Cardiovascular;  Laterality: N/A;   TONSILLECTOMY     TUBAL LIGATION     Patient Active Problem List   Diagnosis Date Noted   Osteopenia 12/04/2023   Urinary incontinence 12/04/2023   DOE (dyspnea on exertion) 10/21/2014   Abnormal cardiac function test 10/21/2014   Dyslipidemia-statin intol 10/21/2014   PAT (paroxysmal atrial tachycardia) (HCC) 10/21/2014   Coronary artery disease due to lipid rich plaque    Expressive aphasia post cath-felt to be secondary to mirgaine 10/20/2014   Essential hypertension 10/20/2014   Cerebral infarction [facility])     PCP: Artemisa Bile MD  REFERRING PROVIDER: Darrin Emerald, MD  REFERRING DIAG: M54.50 (ICD-10-CM) - Lumbar pain   Rationale for Evaluation and  Treatment: Rehabilitation  THERAPY DIAG:  Left-sided low back pain with left-sided sciatica, unspecified chronicity  ONSET DATE: over last 4 years  SUBJECTIVE:                                                                                                                                                                                           SUBJECTIVE STATEMENT: Patient reports she has been doing the exercises her grandson gave her (he's a PT) and the ones we gave her.  Currently little pain but  gets worse in the afternoon/evening.   Pt reports she has been having more pain in the L hip down the back of her hip intermittently that impacts her balance and she has to use a cane at night sometimes because of it.   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 Yes: NPRS scale: 5/10 Pain location: in the L hip Pain description: aching and spreads across to the buttocks and down the back of the leg sometimes to the foot Aggravating factors: standing or sitting for long periods Relieving factors: resting and sometimes moving around, laying out in her bed  PRECAUTIONS: Fall  RED FLAGS: None   WEIGHT BEARING RESTRICTIONS: No  FALLS:  Has patient fallen in last 6 months? No  LIVING ENVIRONMENT: Lives with: lives with their family and on a farm Lives in: House/apartment Stairs: Yes: Internal: 1 steps; none and External: 0 steps; none Has following equipment at home: Single point cane  OCCUPATION: Retired   PLOF: Independent with basic ADLs and Independent with transfers  PATIENT GOALS: "Would like to get so it doesn't cripple me in the evenings. About suppertime I get to  the point to where I can't move. I get these episodes but they don't last and the next day I'm better. When I have to do a lot of bending and moving it gets me"  NEXT MD VISIT: TBD  OBJECTIVE:  Note: Objective measures were completed at Evaluation unless otherwise noted.  PATIENT SURVEYS:  Modified Oswestry 18/50 - 36%   COGNITION: Overall cognitive status: Within functional limits for tasks assessed     SENSATION: WFL  MUSCLE LENGTH: Hamstrings: Right 35 deg; Left 40 deg  POSTURE: forward head and increased lumbar lordosis  PALPATION: L gluteal crest and hip flexor slight tenderness; paraspinal bilateral TTP slightly  LUMBAR ROM:   AROM eval  Flexion 85%  Extension 70%  Right lateral flexion Increased L pain in posterior hip - 75%  Left lateral flexion WFL  Right rotation   Left rotation    (Blank rows = not tested)  LOWER EXTREMITY ROM:     Passive  Right eval Left eval  Hip flexion    Hip extension    Hip abduction    Hip adduction    Hip internal rotation ~25 ~35  Hip external rotation ~65 ~55  Knee flexion    Knee extension    Ankle dorsiflexion    Ankle plantarflexion    Ankle inversion    Ankle eversion     (Blank rows = not tested)  LOWER EXTREMITY MMT:    MMT Right eval Left eval  Hip flexion 4-/5 3+/5  Hip extension    Hip abduction 3+/5 3+/5  Hip adduction    Hip internal rotation    Hip external rotation    Knee flexion 4/5 3+/5  Knee extension 4/5 4-/5  Ankle dorsiflexion 4/5 4-/5  Ankle plantarflexion 4/5 4-/5  Ankle inversion    Ankle eversion     (Blank rows = not tested)  LUMBAR SPECIAL TESTS:  Straight leg raise test: Negative, Slump test: Positive, and FABER test: Negative  FUNCTIONAL TESTS:  5 times sit to stand:  23s 30 seconds chair stand test 6 reps without use of hands SLS: L: 7s  R: 23.61s  GAIT: Distance walked: nt Assistive device utilized: None Level of assistance: Complete Independence Comments:  trendelenburg, decreased foot clearance, decreased thoracic rotation  TREATMENT DATE:  12/18/23 Standing:  heelraises 10X  NBOS balance with head turns up/down, Lt/Rt 10X each (no challenge; no LOB)  Tandem stance: 15" holds, 5X head up/down, 5X head Lt/Rt each LE 1X  SLS 30" holds each LE  Vectors 1 HHA 5X3" each LE  Forward lunges onto 4" step no UE assist 2X10 each  Side stepping RTB at calves 20 ft line 2RT Sit to stands with tidal wave column 2X5 Supine:  Abdominal sets education 5 reps 5" holds  Bridge 2X10"  Hamstring stretch standing and long sitting 2X30" each  12/13/2023  Therapeutic Exercise: -Supine bridges 2 sets of 10 reps, 3 second holds, symptomatic left side but bearable, pt cued for max hip extension -PPT 1 set of 10 reps, 3 second holds, pr cued to remain in pain free ROM -LTR 1 set of 10 reps bilaterally, pt cued to remain in pain free ROM -Lateral stepping 3 laps 20 feet per lap, with RTB around ankles, pt cued for upright posture -Forward lunges, 2 set of 5 reps better performance going into RLE, pt cued for core activation and upright posture, second set on bosu ball, bilateral UE support utilized Neuromuscular Re-education: -Standing balance narrow BOS, 30 seconds -Standing tandem stance(bilaterally), 30 seconds, increased wobble noted -SLS, left side decreased ability noted -Sit to stands, 2 sets of 8 reps, pt cued for core activation, second set with tidal wave column, pt demonstrates slight wobble   12/10/23 Eval + HEP performance and demonstration of PPT w/ engagement and hold                                                                                                                               PATIENT EDUCATION:  Education details: HEP, progressive mobility  Person educated: Patient Education method: Explanation and Demonstration Education comprehension: verbalized understanding  HOME EXERCISE PROGRAM: Access Code: [email or web address] URL:  [email or web address] Date: 12/10/2023 Prepared by: Leonard Raker  Exercises - Supine Posterior Pelvic Tilt  - 1 x daily - 7 x weekly - 2 sets - 10 reps - 10s hold - Supine Lower Trunk Rotation  - 1 x daily - 7 x weekly - 2 sets - 10 reps - Supine Piriformis Stretch with Leg Straight  - 1 x daily - 7 x weekly - 2 sets - 4 reps - 15s hold - Clamshell  - 1 x daily - 7 x weekly - 3 sets - 10 reps - Seated Slump Nerve Glide  - 1 x daily - 7 x weekly - 3 sets - 10 reps  ASSESSMENT:  CLINICAL IMPRESSION: Continued with established therex.  Pt requires minimal cues to complete in correct form and hold times.  Improved SLS  ability today so increased challenge with head turns. Lateral turns more difficult than horizontal.   Pt able to complete tandem with head turns with no challenge.  Vectors added to additionally strengthen gluts and stabilizers.  Noted tightness of hamstrings from evaluation, completing in both standing and long sitting positions.  Pt with more comfort and better stretch in long sitting.  Encouraged to do these at home but did not give print out.  Patient will continue to benefit from skilled physical therapy for increased endurance with ambulation, increased LE strength, decreased LBP, and improved balance for improved quality of life, improved gait quality and continued progress towards therapy goals.  Patient is a 83 y.o. female who was seen today for physical therapy evaluation and treatment for referral of low back pain presenting with sxs consistent of lumbar dysfunction impacting L posterior buttock and L LE weakness as compared to R. Demonstrates decreased balance reactions in standing with transfers and gait changes requiring supervision as well as decreased functional core engagement during transfers and gait. Recommend continued skilled physical therapy for improving gait mechanics, functional lumbo pelvic activation and strength and for improving tolerance to  transfers / routine ADL performance.   OBJECTIVE IMPAIRMENTS: Abnormal gait, decreased activity tolerance, decreased balance, decreased endurance, decreased mobility, difficulty walking, decreased strength, impaired flexibility, improper body mechanics, postural dysfunction, and pain.   ACTIVITY LIMITATIONS: carrying, bending, sitting, standing, squatting, transfers, and functional activity at end of the day  PARTICIPATION LIMITATIONS: meal prep, cleaning, community activity, and yard work  PERSONAL FACTORS: Age and 1-2 comorbidities: CAD, HTN  are also affecting patient's functional outcome.   REHAB POTENTIAL: Good  CLINICAL DECISION MAKING: Evolving/moderate complexity  EVALUATION COMPLEXITY: Moderate   GOALS: Goals reviewed with patient? Yes  SHORT TERM GOALS: Target date: 12/24/23  Pt will be independent with HEP with compliance for at least 75% of the week.  Baseline: prescribed HEP Goal status: INITIAL  2.  Pt will be able to perform slump test with negative for increase in sxs down L LE to indicate improved neural mobility to progress with strengthening and mobility interventions.  Baseline: + slump test left Goal status: INITIAL  LONG TERM GOALS: Target date: 01/24/24  Pt will be able to perform FTSTS in <18 s without UE use or increase in pain for demonstrating improved functional strength and endurance.  Baseline: see objective Goal status: INITIAL  2.  Pt will score at <10% disability on ODI for indicating improved functional tolerance to ADL and improved QOL. Baseline:  Goal status: INITIAL  3.  Pt will report <1/10 pain at end of day for indicating improved functional postural and transfer mechanics appropriately transferring load and reducing load on lumbar spine.  Baseline: see objective Goal status: INITIAL   PLAN:  PT FREQUENCY: 2x/week  PT DURATION: 6 weeks  PLANNED INTERVENTIONS: 97110-Therapeutic exercises, 97530- Therapeutic activity, 97112-  Neuromuscular re-education, 97535- Self Care, [id number]- Manual therapy, [phone]- Gait training, [phone]- Electrical stimulation (unattended), [phone]- Electrical stimulation (manual), Patient/Family education, Balance training, Stair training, Taping, Dry Needling, Joint mobilization, Joint manipulation, and Spinal mobilization.  PLAN FOR NEXT SESSION: Continue HEP and progress strengthening/core stabilization interventions; formal assessment of balance.  Add long sitting hamstring stretch to HEP and add piriformis stretch.   Lurena Nida, PTA/CLT [facility] Ph: [phone] 11:00 AM, 12/18/23

## 2023-12-26 ENCOUNTER — Ambulatory Visit (HOSPITAL_COMMUNITY)

## 2023-12-26 ENCOUNTER — Encounter (HOSPITAL_COMMUNITY): Payer: Self-pay

## 2023-12-26 DIAGNOSIS — M5442 Lumbago with sciatica, left side: Secondary | ICD-10-CM

## 2023-12-26 NOTE — Therapy (Signed)
 OUTPATIENT PHYSICAL THERAPY THORACOLUMBAR TREATMENT   Patient Name: LASONYA HUBNER MRN: [id number] DOB:1941/02/23, 83 y.o., female Today's Date: 12/26/2023  END OF SESSION:  PT End of Session - 12/26/23 1100     Visit Number 4    Number of Visits 12    Date for PT Re-Evaluation 01/23/24    Authorization Type [employer] HMO/PPO    PT Start Time 1103    PT Stop Time 1144    PT Time Calculation (min) 41 min    Activity Tolerance Patient tolerated treatment well    Behavior During Therapy WFL for tasks assessed/performed               Past Medical History:  Diagnosis Date   Arthritis    "minor" (10/20/2014)   Coronary artery disease    High cholesterol    Hypertension    Migraine    "visual" (10/20/2014)   Pneumonia ~ 1957   Past Surgical History:  Procedure Laterality Date   CARDIAC CATHETERIZATION  10/20/2014   COLONOSCOPY  09/25/2011   Procedure: COLONOSCOPY;  Surgeon: Suzette Espy, MD;  Location: [facility];  Service: Endoscopy;  Laterality: N/A;  12:15 pm   LEFT HEART CATHETERIZATION WITH CORONARY ANGIOGRAM N/A 10/20/2014   Procedure: LEFT HEART CATHETERIZATION WITH CORONARY ANGIOGRAM;  Surgeon: Odie Benne, MD;  Location: [facility] CATH LAB;  Service: Cardiovascular;  Laterality: N/A;   TONSILLECTOMY     TUBAL LIGATION     Patient Active Problem List   Diagnosis Date Noted   Osteopenia 12/04/2023   Urinary incontinence 12/04/2023   DOE (dyspnea on exertion) 10/21/2014   Abnormal cardiac function test 10/21/2014   Dyslipidemia-statin intol 10/21/2014   PAT (paroxysmal atrial tachycardia) (HCC) 10/21/2014   Coronary artery disease due to lipid rich plaque    Expressive aphasia post cath-felt to be secondary to mirgaine 10/20/2014   Essential hypertension 10/20/2014   Cerebral infarction [facility])     PCP: Artemisa Bile MD  REFERRING PROVIDER: Darrin Emerald, MD  REFERRING DIAG: M54.50 (ICD-10-CM) - Lumbar pain   Rationale for Evaluation and  Treatment: Rehabilitation  THERAPY DIAG:  Left-sided low back pain with left-sided sciatica, unspecified chronicity  ONSET DATE: over last 4 years  SUBJECTIVE:                                                                                                                                                                                           SUBJECTIVE STATEMENT: Patient reports she's been trying to do HEP. Can't do it in the evening, pain is a 8/10. Reports current pain as 1 or 2/10.  Pt reports she has been having more pain in the L hip down the back of her hip intermittently that impacts her balance and she has to use a cane at night sometimes because of it.   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 Yes: NPRS scale: 5/10 Pain location: in the L hip Pain description: aching and spreads across to the buttocks and down the back of the leg sometimes to the foot Aggravating factors: standing or sitting for long periods Relieving factors: resting and sometimes moving around, laying out in her bed  PRECAUTIONS: Fall  RED FLAGS: None   WEIGHT BEARING RESTRICTIONS: No  FALLS:  Has patient fallen in last 6 months? No  LIVING ENVIRONMENT: Lives with: lives with their family and on a farm Lives in: House/apartment Stairs: Yes: Internal: 1 steps; none and External: 0 steps; none Has following equipment at home: Single point cane  OCCUPATION: Retired   PLOF: Independent with basic ADLs and Independent with transfers  PATIENT GOALS: "Would like to get so it doesn't cripple me in the evenings. About suppertime I get to the point to where I can't move. I get these  episodes but they don't last and the next day I'm better. When I have to do a lot of bending and moving it gets me"  NEXT MD VISIT: TBD  OBJECTIVE:  Note: Objective measures were completed at Evaluation unless otherwise noted.  PATIENT SURVEYS:  Modified Oswestry 18/50 - 36%   COGNITION: Overall cognitive status: Within functional limits for tasks assessed     SENSATION: WFL  MUSCLE LENGTH: Hamstrings: Right 35 deg; Left 40 deg  POSTURE: forward head and increased lumbar lordosis  PALPATION: L gluteal crest and hip flexor slight tenderness; paraspinal bilateral TTP slightly  LUMBAR ROM:   AROM eval  Flexion 85%  Extension 70%  Right lateral flexion Increased L pain in posterior hip - 75%  Left lateral flexion WFL  Right rotation   Left rotation    (Blank rows = not tested)  LOWER EXTREMITY ROM:     Passive  Right eval Left eval  Hip flexion    Hip extension    Hip abduction    Hip adduction    Hip internal rotation ~25 ~35  Hip external rotation ~65 ~55  Knee flexion    Knee extension    Ankle dorsiflexion    Ankle plantarflexion    Ankle inversion    Ankle eversion     (Blank rows = not tested)  LOWER EXTREMITY MMT:    MMT Right eval Left eval  Hip flexion 4-/5 3+/5  Hip extension    Hip abduction 3+/5 3+/5  Hip adduction    Hip internal rotation    Hip external rotation    Knee flexion 4/5 3+/5  Knee extension 4/5 4-/5  Ankle dorsiflexion 4/5 4-/5  Ankle plantarflexion 4/5 4-/5  Ankle inversion    Ankle eversion     (Blank rows = not tested)  LUMBAR SPECIAL TESTS:  Straight leg raise test: Negative, Slump test: Positive, and FABER test: Negative  FUNCTIONAL TESTS:  5 times sit to stand: 23s 30 seconds chair stand test 6  reps without use of hands SLS: L: 7s  R: 23.61s  GAIT: Distance walked: nt Assistive device utilized: None Level of assistance: Complete Independence Comments: trendelenburg, decreased foot clearance, decreased  thoracic rotation  TREATMENT DATE:  12/26/23: NuStep, seat 6, 6', level 1.  Supine PPT Supine bridges  10x 10x2 w/ LE on physioball Seated forward flexion at mat table,  10x2 forward and to right Squats  10x  10x with hip hinge, addition of dowel across upper back for posture Marching, YTB around knees, no UE support, 2x10 for balance challenge Hip abd, YTB around knees, on foam pad, 2x10, One UE support-->one finger support   12/18/23 Standing:  heelraises 10X  NBOS balance with head turns up/down, Lt/Rt 10X each (no challenge; no LOB)  Tandem stance: 15" holds, 5X head up/down, 5X head Lt/Rt each LE 1X  SLS 30" holds each LE  Vectors 1 HHA 5X3" each LE  Forward lunges onto 4" step no UE assist 2X10 each  Side stepping RTB at calves 20 ft line 2RT Sit to stands with tidal wave column 2X5 Supine:  Abdominal sets education 5 reps 5" holds  Bridge 2X10"  Hamstring stretch standing and long sitting 2X30" each  12/13/2023  Therapeutic Exercise: -Supine bridges 2 sets of 10 reps, 3 second holds, symptomatic left side but bearable, pt cued for max hip extension -PPT 1 set of 10 reps, 3 second holds, pr cued to remain in pain free ROM -LTR 1 set of 10 reps bilaterally, pt cued to remain in pain free ROM -Lateral stepping 3 laps 20 feet per lap, with RTB around ankles, pt cued for upright posture -Forward lunges, 2 set of 5 reps better performance going into RLE, pt cued for core activation and upright posture, second set on bosu ball, bilateral UE support utilized Neuromuscular Re-education: -Standing balance narrow BOS, 30 seconds -Standing tandem stance(bilaterally), 30 seconds, increased wobble noted -SLS, left side decreased ability noted -Sit to stands, 2 sets of 8 reps, pt cued for core activation, second set with tidal wave column, pt demonstrates slight wobble   12/10/23 Eval + HEP performance and demonstration of PPT w/ engagement and hold                                                                                                                                PATIENT EDUCATION:  Education details: HEP, progressive mobility  Person educated: Patient Education method: Explanation and Demonstration Education comprehension: verbalized understanding  HOME EXERCISE PROGRAM: Access Code: [email or web address] URL: [email or web address] Date: 12/10/2023 Prepared by: Leonard Raker  Exercises - Supine Posterior Pelvic Tilt  - 1 x daily - 7 x weekly - 2 sets - 10 reps - 10s hold - Supine Lower Trunk Rotation  - 1 x daily - 7 x weekly - 2 sets - 10 reps - Supine Piriformis Stretch with Leg Straight  - 1 x daily - 7 x weekly - 2 sets -  4 reps - 15s hold - Clamshell  - 1 x daily - 7 x weekly - 3 sets - 10 reps - Seated Slump Nerve Glide  - 1 x daily - 7 x weekly - 3 sets - 10 reps  ASSESSMENT:  CLINICAL IMPRESSION: Session begins with education on her grade 1 spondylolisthesis, importance of compliance of HEP to see results of PT. Patient curious about injections into back to help with pain relief. Told to speak with doctor as out of scope for PT but that results are variable between patients. NuStep for general warm up. Review of slump nerve glide HEP, per pt request, good carryover. Progressed bridge and squatting on this day with pt tolerating well. V cues needed during squats with addition of hip hinge to limit tibial translation. Patient given dowel across upper back to help address posture during.  Addition of seated forward and to right flexion at table with towel to assist for when pain is increased in low back, pt with good carryover. Balance challenge with resisted marching and hip abd, pt requiring UE support to complete. Patient will continue to benefit from skilled physical therapy for increased endurance with ambulation, increased LE strength, decreased LBP, and improved balance for improved QOL, and continued progress towards therapy goals.   Patient is  a 83 y.o. female who was seen today for physical therapy evaluation and treatment for referral of low back pain presenting with sxs consistent of lumbar dysfunction impacting L posterior buttock and L LE weakness as compared to R. Demonstrates decreased balance reactions in standing with transfers and gait changes requiring supervision as well as decreased functional core engagement during transfers and gait. Recommend continued skilled physical therapy for improving gait mechanics, functional lumbo pelvic activation and strength and for improving tolerance to transfers / routine ADL performance.   OBJECTIVE IMPAIRMENTS: Abnormal gait, decreased activity tolerance, decreased balance, decreased endurance, decreased mobility, difficulty walking, decreased strength, impaired flexibility, improper body mechanics, postural dysfunction, and pain.   ACTIVITY LIMITATIONS: carrying, bending, sitting, standing, squatting, transfers, and functional activity at end of the day  PARTICIPATION LIMITATIONS: meal prep, cleaning, community activity, and yard work  PERSONAL FACTORS: Age and 1-2 comorbidities: CAD, HTN  are also affecting patient's functional outcome.   REHAB POTENTIAL: Good  CLINICAL DECISION MAKING: Evolving/moderate complexity  EVALUATION COMPLEXITY: Moderate   GOALS: Goals reviewed with patient? Yes  SHORT TERM GOALS: Target date: 12/24/23  Pt will be independent with HEP with compliance for at least 75% of the week.  Baseline: prescribed HEP Goal status: INITIAL  2.  Pt will be able to perform slump test with negative for increase in sxs down L LE to indicate improved neural mobility to progress with strengthening and mobility interventions.  Baseline: + slump test left Goal status: INITIAL  LONG TERM GOALS: Target date: 01/24/24  Pt will be able to perform FTSTS in <18 s without UE use or increase in pain for demonstrating improved functional strength and endurance.  Baseline: see  objective Goal status: INITIAL  2.  Pt will score at <10% disability on ODI for indicating improved functional tolerance to ADL and improved QOL. Baseline:  Goal status: INITIAL  3.  Pt will report <1/10 pain at end of day for indicating improved functional postural and transfer mechanics appropriately transferring load and reducing load on lumbar spine.  Baseline: see objective Goal status: INITIAL   PLAN:  PT FREQUENCY: 2x/week  PT DURATION: 6 weeks  PLANNED INTERVENTIONS: 97110-Therapeutic exercises, 97530- Therapeutic  activity, [id number]- Neuromuscular re-education, [phone]- Self Care, [id number]- Manual therapy, [phone]- Gait training, [phone]- Electrical stimulation (unattended), [phone]- Electrical stimulation (manual), Patient/Family education, Balance training, Stair training, Taping, Dry Needling, Joint mobilization, Joint manipulation, and Spinal mobilization.  PLAN FOR NEXT SESSION: Continue HEP and progress strengthening/core stabilization interventions; formal assessment of balance.  Add long sitting hamstring stretch to HEP and add piriformis stretch.    11:48 AM, 12/26/23 Marysue Sola, PT, DPT [facility] [place]

## 2024-01-01 ENCOUNTER — Ambulatory Visit (HOSPITAL_COMMUNITY)

## 2024-01-01 ENCOUNTER — Encounter (HOSPITAL_COMMUNITY): Payer: Self-pay

## 2024-01-01 DIAGNOSIS — M5442 Lumbago with sciatica, left side: Secondary | ICD-10-CM | POA: Diagnosis not present

## 2024-01-01 NOTE — Therapy (Signed)
 OUTPATIENT PHYSICAL THERAPY THORACOLUMBAR TREATMENT   Patient Name: Anna Hicks MRN: [id number] DOB:1941-02-03, 83 y.o., female Today's Date: 01/01/2024  END OF SESSION:  PT End of Session - 01/01/24 1022     Visit Number 5    Number of Visits 12    Date for PT Re-Evaluation 01/23/24    Authorization Type [employer] HMO/PPO    PT Start Time 1022    PT Stop Time 1101    PT Time Calculation (min) 39 min    Activity Tolerance Patient tolerated treatment well    Behavior During Therapy WFL for tasks assessed/performed               Past Medical History:  Diagnosis Date   Arthritis    "minor" (10/20/2014)   Coronary artery disease    High cholesterol    Hypertension    Migraine    "visual" (10/20/2014)   Pneumonia ~ 1957   Past Surgical History:  Procedure Laterality Date   CARDIAC CATHETERIZATION  10/20/2014   COLONOSCOPY  09/25/2011   Procedure: COLONOSCOPY;  Surgeon: Suzette Espy, MD;  Location: [facility];  Service: Endoscopy;  Laterality: N/A;  12:15 pm   LEFT HEART CATHETERIZATION WITH CORONARY ANGIOGRAM N/A 10/20/2014   Procedure: LEFT HEART CATHETERIZATION WITH CORONARY ANGIOGRAM;  Surgeon: Odie Benne, MD;  Location: [facility] CATH LAB;  Service: Cardiovascular;  Laterality: N/A;   TONSILLECTOMY     TUBAL LIGATION     Patient Active Problem List   Diagnosis Date Noted   Osteopenia 12/04/2023   Urinary incontinence 12/04/2023   DOE (dyspnea on exertion) 10/21/2014   Abnormal cardiac function test 10/21/2014   Dyslipidemia-statin intol 10/21/2014   PAT (paroxysmal atrial tachycardia) (HCC) 10/21/2014   Coronary artery disease due to lipid rich plaque    Expressive aphasia post cath-felt to be secondary to mirgaine 10/20/2014   Essential hypertension 10/20/2014   Cerebral infarction (HCC)     PCP: Artemisa Bile MD  REFERRING PROVIDER: Darrin Emerald, MD  REFERRING DIAG: M54.50 (ICD-10-CM) - Lumbar pain   Rationale for Evaluation and  Treatment: Rehabilitation  THERAPY DIAG:  Left-sided low back pain with left-sided sciatica, unspecified chronicity  ONSET DATE: over last 4 years  SUBJECTIVE:                                                                                                                                                                                           SUBJECTIVE STATEMENT: No reports of pain currently, stated more pain in afternoon especially when having to reach down low.  Reports she liked the stretches added last session.  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 Yes: NPRS scale: 5/10 Pain location: in the L hip Pain description: aching and spreads across to the buttocks and down the back of the leg sometimes to the foot Aggravating factors: standing or sitting for long periods Relieving factors: resting and sometimes moving around, laying out in her bed  PRECAUTIONS: Fall  RED FLAGS: None   WEIGHT BEARING RESTRICTIONS: No  FALLS:  Has patient fallen in last 6 months? No  LIVING ENVIRONMENT: Lives with: lives with their family and on a farm Lives in: House/apartment Stairs: Yes: Internal: 1 steps; none and External: 0 steps; none Has following equipment at home: Single point cane  OCCUPATION: Retired   PLOF: Independent with basic ADLs and Independent with transfers  PATIENT GOALS: "Would like to get so it doesn't cripple me in the evenings. About suppertime I get to the point to where I can't move. I get these episodes but they don't last and the next day I'm better. When I have to do a lot of bending and moving it gets me"  NEXT MD VISIT: TBD  OBJECTIVE:   Note: Objective measures were completed at Evaluation unless otherwise noted.  PATIENT SURVEYS:  Modified Oswestry 18/50 - 36%   COGNITION: Overall cognitive status: Within functional limits for tasks assessed     SENSATION: WFL  MUSCLE LENGTH: Hamstrings: Right 35 deg; Left 40 deg  POSTURE: forward head and increased lumbar lordosis  PALPATION: L gluteal crest and hip flexor slight tenderness; paraspinal bilateral TTP slightly  LUMBAR ROM:   AROM eval  Flexion 85%  Extension 70%  Right lateral flexion Increased L pain in posterior hip - 75%  Left lateral flexion WFL  Right rotation   Left rotation    (Blank rows = not tested)  LOWER EXTREMITY ROM:     Passive  Right eval Left eval  Hip flexion    Hip extension    Hip abduction    Hip adduction    Hip internal rotation ~25 ~35  Hip external rotation ~65 ~55  Knee flexion    Knee extension    Ankle dorsiflexion    Ankle plantarflexion    Ankle inversion    Ankle eversion     (Blank rows = not tested)  LOWER EXTREMITY MMT:    MMT Right eval Left eval  Hip flexion 4-/5 3+/5  Hip extension    Hip abduction 3+/5 3+/5  Hip adduction    Hip internal rotation    Hip external rotation    Knee flexion 4/5 3+/5  Knee extension 4/5 4-/5  Ankle dorsiflexion 4/5 4-/5  Ankle plantarflexion 4/5 4-/5  Ankle inversion    Ankle eversion     (Blank rows = not tested)  LUMBAR SPECIAL TESTS:  Straight leg raise test: Negative, Slump test: Positive, and FABER test: Negative  FUNCTIONAL TESTS:  5 times sit to stand: 23s 30 seconds chair stand test 6 reps without use of hands SLS: L: 7s  R: 23.61s  GAIT: Distance walked: nt Assistive device utilized: None Level of assistance: Complete Independence Comments: trendelenburg, decreased foot clearance, decreased thoracic rotation  TREATMENT DATE:  01/01/24:  STS 10x eccentric control no HHA Standing: - Shoulder extension with RTB with marching 10x 3" theraband  CGA - Rows with RTB 10x  - Squat 15 x front of chair cueing for mechanics for increased gluteal activation and reduce anterior aspect of knee pressure - Tandem stance 1x 30", 2x 30" on foam - Vector stance 3x 5" with 1 HHA Seated  - piriformis stretch 3x 30" - Long sitting hamstring 2x 30" Trigger Point Dry Needling  What is Trigger Point Dry Needling (DN)? DN is a physical therapy technique used to treat muscle pain and dysfunction. Specifically, DN helps deactivate muscle trigger points (muscle knots).  A thin filiform needle is used to penetrate the skin and stimulate the underlying trigger point. The goal is for a local twitch response (LTR) to occur and for the trigger point to relax. No medication of any kind is injected during the procedure.   What Does Trigger Point Dry Needling Feel Like?  The procedure feels different for each individual patient. Some patients report that they do not actually feel the needle enter the skin and overall the process is not painful. Very mild bleeding may occur. However, many patients feel a deep cramping in the muscle in which the needle was inserted. This is the local twitch response.   How Will I feel after the treatment? Soreness is normal, and the onset of soreness may not occur for a few hours. Typically this soreness does not last longer than two days.  Bruising is uncommon, however; ice can be used to decrease any possible bruising.  In rare cases feeling tired or nauseous after the treatment is normal. In addition, your symptoms may get worse before they get better, this period will typically not last longer than 24 hours.   What Can I do After My Treatment? Increase your hydration by drinking more water  for the next 24 hours.  You may place ice or heat on the areas treated that have become sore, however, do not use heat on inflamed or bruised areas. Heat often brings more relief post needling. You can continue your regular activities, but  vigorous activity is not recommended initially after the treatment for 24 hours. DN is best combined with other physical therapy such as strengthening, stretching, and other therapies.   What are the complications? While your therapist has had extensive training in minimizing the risks of trigger point dry needling, it is important to understand the risks of any procedure.  Risks include bleeding, pain, fatigue, hematoma, infection, vertigo, nausea or nerve involvement. Monitor for any changes to your skin or sensation. Contact your therapist or MD with concerns.  A rare but serious complication is a pneumothorax over or near your middle and upper chest and back If you have dry needling in this area, monitor for the following symptoms: Shortness of breath on exertion and/or Difficulty taking a deep breath and/or Chest Pain and/or A dry cough If any of the above symptoms develop, please go to the nearest emergency room or call 911. Tell them you had dry needling over your thorax and report any symptoms you are having. Please follow-up with your treating therapist after you complete the medical evaluation.    12/26/23: NuStep, seat 6, 6', level 1.  Supine PPT Supine bridges  10x 10x2 w/ LE on physioball Seated forward flexion at mat table,  10x2 forward and to right Squats  10x  10x with hip hinge, addition of dowel across upper back for posture Marching, YTB  around knees, no UE support, 2x10 for balance challenge Hip abd, YTB around knees, on foam pad, 2x10, One UE support-->one finger support   12/18/23 Standing:  heelraises 10X  NBOS balance with head turns up/down, Lt/Rt 10X each (no challenge; no LOB)  Tandem stance: 15" holds, 5X head up/down, 5X head Lt/Rt each LE 1X  SLS 30" holds each LE  Vectors 1 HHA 5X3" each LE  Forward lunges onto 4" step no UE assist 2X10 each  Side stepping RTB at calves 20 ft line 2RT Sit to stands with tidal wave column 2X5 Supine:  Abdominal  sets education 5 reps 5" holds  Bridge 2X10"  Hamstring stretch standing and long sitting 2X30" each  12/13/2023  Therapeutic Exercise: -Supine bridges 2 sets of 10 reps, 3 second holds, symptomatic left side but bearable, pt cued for max hip extension -PPT 1 set of 10 reps, 3 second holds, pr cued to remain in pain free ROM -LTR 1 set of 10 reps bilaterally, pt cued to remain in pain free ROM -Lateral stepping 3 laps 20 feet per lap, with RTB around ankles, pt cued for upright posture -Forward lunges, 2 set of 5 reps better performance going into RLE, pt cued for core activation and upright posture, second set on bosu ball, bilateral UE support utilized Neuromuscular Re-education: -Standing balance narrow BOS, 30 seconds -Standing tandem stance(bilaterally), 30 seconds, increased wobble noted -SLS, left side decreased ability noted -Sit to stands, 2 sets of 8 reps, pt cued for core activation, second set with tidal wave column, pt demonstrates slight wobble   12/10/23 Eval + HEP performance and demonstration of PPT w/ engagement and hold                                                                                                                               PATIENT EDUCATION:  Education details: HEP, progressive mobility  Person educated: Patient Education method: Explanation and Demonstration Education comprehension: verbalized understanding  HOME EXERCISE PROGRAM: Access Code: [email or web address] URL: [email or web address] Date: 12/10/2023 Prepared by: Leonard Raker  Exercises - Supine Posterior Pelvic Tilt  - 1 x daily - 7 x weekly - 2 sets - 10 reps - 10s hold - Supine Lower Trunk Rotation  - 1 x daily - 7 x weekly - 2 sets - 10 reps - Supine Piriformis Stretch with Leg Straight  - 1 x daily - 7 x weekly - 2 sets - 4 reps - 15s hold - Clamshell  - 1 x daily - 7 x weekly - 3 sets - 10 reps - Seated Slump Nerve Glide  - 1 x daily - 7 x weekly - 3 sets - 10 reps  -seated  piriformis stretch 3x 30"  ASSESSMENT:  CLINICAL IMPRESSION: Pt was asking details with dry needling, given handout with information.  Session focus with core and proximal strengthening with additional postural strengthening exercises and stretches for mobility.  Pt with noted hip  instability during marching exercises with CGA for safety.  Added seated piriformis stretch and long sitting hamstring stretch to HEP with printout given and verbalized understanding.  Able to progress to dynamic surface with static balance activities with intermittent HHA during tandem stance.   Patient is a 83 y.o. female who was seen today for physical therapy evaluation and treatment for referral of low back pain presenting with sxs consistent of lumbar dysfunction impacting L posterior buttock and L LE weakness as compared to R. Demonstrates decreased balance reactions in standing with transfers and gait changes requiring supervision as well as decreased functional core engagement during transfers and gait. Recommend continued skilled physical therapy for improving gait mechanics, functional lumbo pelvic activation and strength and for improving tolerance to transfers / routine ADL performance.   OBJECTIVE IMPAIRMENTS: Abnormal gait, decreased activity tolerance, decreased balance, decreased endurance, decreased mobility, difficulty walking, decreased strength, impaired flexibility, improper body mechanics, postural dysfunction, and pain.   ACTIVITY LIMITATIONS: carrying, bending, sitting, standing, squatting, transfers, and functional activity at end of the day  PARTICIPATION LIMITATIONS: meal prep, cleaning, community activity, and yard work  PERSONAL FACTORS: Age and 1-2 comorbidities: CAD, HTN  are also affecting patient's functional outcome.   REHAB POTENTIAL: Good  CLINICAL DECISION MAKING: Evolving/moderate complexity  EVALUATION COMPLEXITY: Moderate   GOALS: Goals reviewed with patient? Yes  SHORT  TERM GOALS: Target date: 12/24/23  Pt will be independent with HEP with compliance for at least 75% of the week.  Baseline: prescribed HEP Goal status: INITIAL  2.  Pt will be able to perform slump test with negative for increase in sxs down L LE to indicate improved neural mobility to progress with strengthening and mobility interventions.  Baseline: + slump test left Goal status: INITIAL  LONG TERM GOALS: Target date: 01/24/24  Pt will be able to perform FTSTS in <18 s without UE use or increase in pain for demonstrating improved functional strength and endurance.  Baseline: see objective Goal status: INITIAL  2.  Pt will score at <10% disability on ODI for indicating improved functional tolerance to ADL and improved QOL. Baseline:  Goal status: INITIAL  3.  Pt will report <1/10 pain at end of day for indicating improved functional postural and transfer mechanics appropriately transferring load and reducing load on lumbar spine.  Baseline: see objective Goal status: INITIAL   PLAN:  PT FREQUENCY: 2x/week  PT DURATION: 6 weeks  PLANNED INTERVENTIONS: 97110-Therapeutic exercises, 97530- Therapeutic activity, 97112- Neuromuscular re-education, 97535- Self Care, [id number]- Manual therapy, [phone]- Gait training, [phone]- Electrical stimulation (unattended), [phone]- Electrical stimulation (manual), Patient/Family education, Balance training, Stair training, Taping, Dry Needling, Joint mobilization, Joint manipulation, and Spinal mobilization.  PLAN FOR NEXT SESSION: Continue HEP and progress strengthening/core stabilization interventions; formal assessment of balance.  Add lunges and work on floor to standing as appropriate.    Minor Amble, LPTA/CLT; CBIS [phone]  11:15 AM, 01/01/24

## 2024-01-03 ENCOUNTER — Encounter (HOSPITAL_COMMUNITY): Payer: Self-pay

## 2024-01-03 ENCOUNTER — Ambulatory Visit (HOSPITAL_COMMUNITY): Attending: Orthopedic Surgery

## 2024-01-03 DIAGNOSIS — M5442 Lumbago with sciatica, left side: Secondary | ICD-10-CM | POA: Diagnosis present

## 2024-01-03 NOTE — Therapy (Signed)
 OUTPATIENT PHYSICAL THERAPY THORACOLUMBAR TREATMENT   Patient Name: Anna Hicks MRN: [id number] DOB:May 07, 1941, 83 y.o., female Today's Date: 01/03/2024  END OF SESSION:  PT End of Session - 01/03/24 1026     Visit Number 6    Number of Visits 12    Date for PT Re-Evaluation 01/23/24    Authorization Type [employer] HMO/PPO    Progress Note Due on Visit 10    PT Start Time 0931    PT Stop Time 1012    PT Time Calculation (min) 41 min    Activity Tolerance Patient tolerated treatment well    Behavior During Therapy [facility] for tasks assessed/performed                Past Medical History:  Diagnosis Date   Arthritis    "minor" (10/20/2014)   Coronary artery disease    High cholesterol    Hypertension    Migraine    "visual" (10/20/2014)   Pneumonia ~ 1957   Past Surgical History:  Procedure Laterality Date   CARDIAC CATHETERIZATION  10/20/2014   COLONOSCOPY  09/25/2011   Procedure: COLONOSCOPY;  Surgeon: Suzette Espy, MD;  Location: [facility];  Service: Endoscopy;  Laterality: N/A;  12:15 pm   LEFT HEART CATHETERIZATION WITH CORONARY ANGIOGRAM N/A 10/20/2014   Procedure: LEFT HEART CATHETERIZATION WITH CORONARY ANGIOGRAM;  Surgeon: Odie Benne, MD;  Location: [facility] CATH LAB;  Service: Cardiovascular;  Laterality: N/A;   TONSILLECTOMY     TUBAL LIGATION     Patient Active Problem List   Diagnosis Date Noted   Osteopenia 12/04/2023   Urinary incontinence 12/04/2023   DOE (dyspnea on exertion) 10/21/2014   Abnormal cardiac function test 10/21/2014   Dyslipidemia-statin intol 10/21/2014   PAT (paroxysmal atrial tachycardia) (HCC) 10/21/2014   Coronary artery disease due to lipid rich plaque    Expressive aphasia post cath-felt to be secondary to mirgaine 10/20/2014   Essential hypertension 10/20/2014   Cerebral infarction [facility])     PCP: Artemisa Bile MD  REFERRING PROVIDER: Darrin Emerald, MD  REFERRING DIAG: M54.50 (ICD-10-CM) - Lumbar pain    Rationale for Evaluation and Treatment: Rehabilitation  THERAPY DIAG:  Left-sided low back pain with left-sided sciatica, unspecified chronicity  ONSET DATE: over last 4 years  SUBJECTIVE:                                                                                                                                                                                           SUBJECTIVE STATEMENT: Last two afternoons have been better, 6/10. Pt has not had to reach for cane like  normal.    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 Yes: NPRS scale: 5/10 Pain location: in the L hip Pain description: aching and spreads across to the buttocks and down the back of the leg sometimes to the foot Aggravating factors: standing or sitting for long periods Relieving factors: resting and sometimes moving around, laying out in her bed  PRECAUTIONS: Fall  RED FLAGS: None   WEIGHT BEARING RESTRICTIONS: No  FALLS:  Has patient fallen in last 6 months? No  LIVING ENVIRONMENT: Lives with: lives with their family and on a farm Lives in: House/apartment Stairs: Yes: Internal: 1 steps; none and External: 0 steps; none Has following equipment at home: Single point cane  OCCUPATION: Retired   PLOF: Independent with basic ADLs and Independent with transfers  PATIENT GOALS: "Would like to get so it doesn't cripple me in the evenings. About suppertime I get to the point to where I can't move. I get these episodes but they don't last and the next day I'm better. When I have to do a lot of bending and moving it gets me"  NEXT MD VISIT: TBD  OBJECTIVE:  Note: Objective measures were  completed at Evaluation unless otherwise noted.  PATIENT SURVEYS:  Modified Oswestry 18/50 - 36%   COGNITION: Overall cognitive status: Within functional limits for tasks assessed     SENSATION: WFL  MUSCLE LENGTH: Hamstrings: Right 35 deg; Left 40 deg  POSTURE: forward head and increased lumbar lordosis  PALPATION: L gluteal crest and hip flexor slight tenderness; paraspinal bilateral TTP slightly  LUMBAR ROM:   AROM eval  Flexion 85%  Extension 70%  Right lateral flexion Increased L pain in posterior hip - 75%  Left lateral flexion WFL  Right rotation   Left rotation    (Blank rows = not tested)  LOWER EXTREMITY ROM:     Passive  Right eval Left eval  Hip flexion    Hip extension    Hip abduction    Hip adduction    Hip internal rotation ~25 ~35  Hip external rotation ~65 ~55  Knee flexion    Knee extension    Ankle dorsiflexion    Ankle plantarflexion    Ankle inversion    Ankle eversion     (Blank rows = not tested)  LOWER EXTREMITY MMT:    MMT Right eval Left eval  Hip flexion 4-/5 3+/5  Hip extension    Hip abduction 3+/5 3+/5  Hip adduction    Hip internal rotation    Hip external rotation    Knee flexion 4/5 3+/5  Knee extension 4/5 4-/5  Ankle dorsiflexion 4/5 4-/5  Ankle plantarflexion 4/5 4-/5  Ankle inversion    Ankle eversion     (Blank rows = not tested)  LUMBAR SPECIAL TESTS:  Straight leg raise test: Negative, Slump test: Positive, and FABER test: Negative  FUNCTIONAL TESTS:  5 times sit to stand: 23s 30 seconds chair stand test 6 reps without use of hands SLS: L: 7s  R: 23.61s  GAIT: Distance walked: nt Assistive device utilized: None Level of assistance: Complete Independence Comments: trendelenburg, decreased foot clearance, decreased thoracic rotation  TREATMENT DATE:  01/03/2024  Therapeutic Exercise: -DKTC on green exercise ball, 2 sets of 10 reps, pt cued for pain free ROM -LTR on green exercise ball, 2 sets of  10 reps, pt cued for ball placement -PPT with march, 1 set of 10 reps, pt requires multiple VC for correct sequencing -Supine bridges, Blue TB 2 sets of 10 reps, 3 second holds, pt cued for max hip extension -Forwards lunges on Bosu ball, 1 set of 8 reps bilaterally, one UE support -Lateral stepping 3 laps 10 feet per lap, with RTB around ankles, pt cued for upright posture and core activation   Therapeutic Activity: -Sit to stands, 2 sets of 5 reps, second set with tidal tank held at chest, pt cued for core activation     01/01/24: STS 10x eccentric control no HHA Standing: - Shoulder extension with RTB with marching 10x 3" theraband CGA - Rows with RTB 10x  - Squat 15 x front of chair cueing for mechanics for increased gluteal activation and reduce anterior aspect of knee pressure - Tandem stance 1x 30", 2x 30" on foam - Vector stance 3x 5" with 1 HHA Seated  - piriformis stretch 3x 30" - Long sitting hamstring 2x 30" Trigger Point Dry Needling  What is Trigger Point Dry Needling (DN)? DN is a physical therapy technique used to treat muscle pain and dysfunction. Specifically, DN helps deactivate muscle trigger points (muscle knots).  A thin filiform needle is used to penetrate the skin and stimulate the underlying trigger point. The goal is for a local twitch response (LTR) to occur and for the trigger point to relax. No medication of any kind is injected during the procedure.   What Does Trigger Point Dry Needling Feel Like?  The procedure feels different for each individual patient. Some patients report that they do not actually feel the needle enter the skin and overall the process is not painful. Very mild bleeding may occur. However, many patients feel a deep cramping in the muscle in which the needle was inserted. This is the local twitch response.   How Will I feel after the treatment? Soreness is normal, and the onset of soreness may not occur for a few hours. Typically this  soreness does not last longer than two days.  Bruising is uncommon, however; ice can be used to decrease any possible bruising.  In rare cases feeling tired or nauseous after the treatment is normal. In addition, your symptoms may get worse before they get better, this period will typically not last longer than 24 hours.   What Can I do After My Treatment? Increase your hydration by drinking more water  for the next 24 hours.  You may place ice or heat on the areas treated that have become sore, however, do not use heat on inflamed or bruised areas. Heat often brings more relief post needling. You can continue your regular activities, but vigorous activity is not recommended initially after the treatment for 24 hours. DN is best combined with other physical therapy such as strengthening, stretching, and other therapies.   What are the complications? While your therapist has had extensive training in minimizing the risks of trigger point dry needling, it is important to understand the risks of any procedure.  Risks include bleeding, pain, fatigue, hematoma, infection, vertigo, nausea or nerve involvement. Monitor for any changes to your skin or sensation. Contact your therapist or MD with concerns.  A rare but serious complication is a pneumothorax over or near  your middle and upper chest and back If you have dry needling in this area, monitor for the following symptoms: Shortness of breath on exertion and/or Difficulty taking a deep breath and/or Chest Pain and/or A dry cough If any of the above symptoms develop, please go to the nearest emergency room or call 911. Tell them you had dry needling over your thorax and report any symptoms you are having. Please follow-up with your treating therapist after you complete the medical evaluation.    12/26/23: NuStep, seat 6, 6', level 1.  Supine PPT Supine bridges  10x 10x2 w/ LE on physioball Seated forward flexion at mat table,  10x2 forward  and to right Squats  10x  10x with hip hinge, addition of dowel across upper back for posture Marching, YTB around knees, no UE support, 2x10 for balance challenge Hip abd, YTB around knees, on foam pad, 2x10, One UE support-->one finger support                                                                                                                                  PATIENT EDUCATION:  Education details: HEP, progressive mobility  Person educated: Patient Education method: Explanation and Demonstration Education comprehension: verbalized understanding  HOME EXERCISE PROGRAM: Access Code: [email or web address] URL: [email or web address] Date: 12/10/2023 Prepared by: Leonard Raker  Exercises - Supine Posterior Pelvic Tilt  - 1 x daily - 7 x weekly - 2 sets - 10 reps - 10s hold - Supine Lower Trunk Rotation  - 1 x daily - 7 x weekly - 2 sets - 10 reps - Supine Piriformis Stretch with Leg Straight  - 1 x daily - 7 x weekly - 2 sets - 4 reps - 15s hold - Clamshell  - 1 x daily - 7 x weekly - 3 sets - 10 reps - Seated Slump Nerve Glide  - 1 x daily - 7 x weekly - 3 sets - 10 reps  -seated piriformis stretch 3x 30"  ASSESSMENT:  CLINICAL IMPRESSION: Patient demonstrates decreased low back pain for the last two days, reported to not have to use cane in the last two days in the afternoons. Pt voices some frustration about seeing a different therapist every time and having to fill new therapist in on current state of back. Pt also feels a bit overwhelmed and asks for clarification on HEP, duplicates were marked through, pt was instructed to cut down to 7 exercises per day as she has been doing all 13 every day. Pt also instructed to incorporate 2 rest days in each week. Pt continues to demonstrate decreased LE strength, decreased gait quality and balance. Patient able to progress dynamic balance and core activation exercises today with STS variation and lateral stepping with mini  squats, good performance with verbal cueing. Patient would continue to benefit from skilled physical therapy for decreased low back pain, increased endurance with ambulation, increased LE strength, and improved balance  for improved quality of life, improved independence with gait training and continued progress towards therapy goals.   Patient is a 83 y.o. female who was seen today for physical therapy evaluation and treatment for referral of low back pain presenting with sxs consistent of lumbar dysfunction impacting L posterior buttock and L LE weakness as compared to R. Demonstrates decreased balance reactions in standing with transfers and gait changes requiring supervision as well as decreased functional core engagement during transfers and gait. Recommend continued skilled physical therapy for improving gait mechanics, functional lumbo pelvic activation and strength and for improving tolerance to transfers / routine ADL performance.   OBJECTIVE IMPAIRMENTS: Abnormal gait, decreased activity tolerance, decreased balance, decreased endurance, decreased mobility, difficulty walking, decreased strength, impaired flexibility, improper body mechanics, postural dysfunction, and pain.   ACTIVITY LIMITATIONS: carrying, bending, sitting, standing, squatting, transfers, and functional activity at end of the day  PARTICIPATION LIMITATIONS: meal prep, cleaning, community activity, and yard work  PERSONAL FACTORS: Age and 1-2 comorbidities: CAD, HTN  are also affecting patient's functional outcome.   REHAB POTENTIAL: Good  CLINICAL DECISION MAKING: Evolving/moderate complexity  EVALUATION COMPLEXITY: Moderate   GOALS: Goals reviewed with patient? Yes  SHORT TERM GOALS: Target date: 12/24/23  Pt will be independent with HEP with compliance for at least 75% of the week.  Baseline: prescribed HEP Goal status: INITIAL  2.  Pt will be able to perform slump test with negative for increase in sxs down L  LE to indicate improved neural mobility to progress with strengthening and mobility interventions.  Baseline: + slump test left Goal status: INITIAL  LONG TERM GOALS: Target date: 01/24/24  Pt will be able to perform FTSTS in <18 s without UE use or increase in pain for demonstrating improved functional strength and endurance.  Baseline: see objective Goal status: INITIAL  2.  Pt will score at <10% disability on ODI for indicating improved functional tolerance to ADL and improved QOL. Baseline:  Goal status: INITIAL  3.  Pt will report <1/10 pain at end of day for indicating improved functional postural and transfer mechanics appropriately transferring load and reducing load on lumbar spine.  Baseline: see objective Goal status: INITIAL   PLAN:  PT FREQUENCY: 2x/week  PT DURATION: 6 weeks  PLANNED INTERVENTIONS: 97110-Therapeutic exercises, 97530- Therapeutic activity, 97112- Neuromuscular re-education, 97535- Self Care, [id number]- Manual therapy, [phone]- Gait training, [phone]- Electrical stimulation (unattended), [phone]- Electrical stimulation (manual), Patient/Family education, Balance training, Stair training, Taping, Dry Needling, Joint mobilization, Joint manipulation, and Spinal mobilization.  PLAN FOR NEXT SESSION: Hold additional HEP prescription unless taking something off as pt is overwhelmed. Attempt quadruped exercises/stretches. Pt was encouraged to incorporate 2 rest days each week.  Pt also would like to hold off on dry needling until the end of therapy.  Armond Bertin, PT, DPT [facility] Office: [phone] 10:37 AM, 01/03/24

## 2024-01-07 ENCOUNTER — Ambulatory Visit (HOSPITAL_COMMUNITY)

## 2024-01-07 ENCOUNTER — Encounter (HOSPITAL_COMMUNITY): Payer: Self-pay

## 2024-01-07 DIAGNOSIS — M5442 Lumbago with sciatica, left side: Secondary | ICD-10-CM | POA: Diagnosis not present

## 2024-01-07 NOTE — Therapy (Signed)
 OUTPATIENT PHYSICAL THERAPY THORACOLUMBAR TREATMENT   Patient Name: Anna Hicks MRN: [id number] DOB:Mar 14, 1941, 83 y.o., female Today's Date: 01/07/2024  END OF SESSION:  PT End of Session - 01/07/24 0847     Visit Number 7    Number of Visits 12    Date for PT Re-Evaluation 01/23/24    Authorization Type [employer] HMO/PPO    Progress Note Due on Visit 10    PT Start Time 0847    PT Stop Time 0927    PT Time Calculation (min) 40 min    Activity Tolerance Patient tolerated treatment well    Behavior During Therapy [facility] for tasks assessed/performed                 Past Medical History:  Diagnosis Date   Arthritis    "minor" (10/20/2014)   Coronary artery disease    High cholesterol    Hypertension    Migraine    "visual" (10/20/2014)   Pneumonia ~ 1957   Past Surgical History:  Procedure Laterality Date   CARDIAC CATHETERIZATION  10/20/2014   COLONOSCOPY  09/25/2011   Procedure: COLONOSCOPY;  Surgeon: Suzette Espy, MD;  Location: [facility];  Service: Endoscopy;  Laterality: N/A;  12:15 pm   LEFT HEART CATHETERIZATION WITH CORONARY ANGIOGRAM N/A 10/20/2014   Procedure: LEFT HEART CATHETERIZATION WITH CORONARY ANGIOGRAM;  Surgeon: Odie Benne, MD;  Location: [facility] CATH LAB;  Service: Cardiovascular;  Laterality: N/A;   TONSILLECTOMY     TUBAL LIGATION     Patient Active Problem List   Diagnosis Date Noted   Osteopenia 12/04/2023   Urinary incontinence 12/04/2023   DOE (dyspnea on exertion) 10/21/2014   Abnormal cardiac function test 10/21/2014   Dyslipidemia-statin intol 10/21/2014   PAT (paroxysmal atrial tachycardia) (HCC) 10/21/2014   Coronary artery disease due to lipid rich plaque    Expressive aphasia post cath-felt to be secondary to mirgaine 10/20/2014   Essential hypertension 10/20/2014   Cerebral infarction [facility])     PCP: Artemisa Bile MD  REFERRING PROVIDER: Darrin Emerald, MD  REFERRING DIAG: M54.50 (ICD-10-CM) - Lumbar  pain   Rationale for Evaluation and Treatment: Rehabilitation  THERAPY DIAG:  Left-sided low back pain with left-sided sciatica, unspecified chronicity  ONSET DATE: over last 4 years  SUBJECTIVE:                                                                                                                                                                                           SUBJECTIVE STATEMENT: Last two days have been better, 5/10. Pt reports she does feel like she is  making some progress. Pt has not had to reach for cane like normal. Pt reports walking is going okay to garden and back.    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 Yes: NPRS scale: 5/10 Pain location: in the L hip Pain description: aching and spreads across to the buttocks and down the back of the leg sometimes to the foot Aggravating factors: standing or sitting for long periods Relieving factors: resting and sometimes moving around, laying out in her bed  PRECAUTIONS: Fall  RED FLAGS: None   WEIGHT BEARING RESTRICTIONS: No  FALLS:  Has patient fallen in last 6 months? No  LIVING ENVIRONMENT: Lives with: lives with their family and on a farm Lives in: House/apartment Stairs: Yes: Internal: 1 steps; none and External: 0 steps; none Has following equipment at home: Single point cane  OCCUPATION: Retired   PLOF: Independent with basic ADLs and Independent with transfers  PATIENT GOALS: "Would like to get so it doesn't cripple me in the evenings. About suppertime I get to the point to where I can't move. I get these episodes but they don't last and the next day I'm better. When I have to do  a lot of bending and moving it gets me"  NEXT MD VISIT: TBD  OBJECTIVE:  Note: Objective measures were completed at Evaluation unless otherwise noted.  PATIENT SURVEYS:  Modified Oswestry 18/50 - 36%   COGNITION: Overall cognitive status: Within functional limits for tasks assessed     SENSATION: WFL  MUSCLE LENGTH: Hamstrings: Right 35 deg; Left 40 deg  POSTURE: forward head and increased lumbar lordosis  PALPATION: L gluteal crest and hip flexor slight tenderness; paraspinal bilateral TTP slightly  LUMBAR ROM:   AROM eval  Flexion 85%  Extension 70%  Right lateral flexion Increased L pain in posterior hip - 75%  Left lateral flexion WFL  Right rotation   Left rotation    (Blank rows = not tested)  LOWER EXTREMITY ROM:     Passive  Right eval Left eval  Hip flexion    Hip extension    Hip abduction    Hip adduction    Hip internal rotation ~25 ~35  Hip external rotation ~65 ~55  Knee flexion    Knee extension    Ankle dorsiflexion    Ankle plantarflexion    Ankle inversion    Ankle eversion     (Blank rows = not tested)  LOWER EXTREMITY MMT:    MMT Right eval Left eval  Hip flexion 4-/5 3+/5  Hip extension    Hip abduction 3+/5 3+/5  Hip adduction    Hip internal rotation    Hip external rotation    Knee flexion 4/5 3+/5  Knee extension 4/5 4-/5  Ankle dorsiflexion 4/5 4-/5  Ankle plantarflexion 4/5 4-/5  Ankle inversion    Ankle eversion     (Blank rows = not tested)  LUMBAR SPECIAL TESTS:  Straight leg raise test: Negative, Slump test: Positive, and FABER test: Negative  FUNCTIONAL TESTS:  5 times sit to stand: 23s 30 seconds chair stand test 6 reps without use of hands SLS: L: 7s  R: 23.61s  GAIT: Distance walked: nt Assistive device utilized: None Level of assistance: Complete Independence Comments: trendelenburg, decreased foot clearance, decreased thoracic rotation  TREATMENT DATE:  01/03/2024  Therapeutic  Exercise: -Single knee to chest, 2 reps, 30 second holds, , pt cued for pain free ROM -LTR hooklying, 2 sets of 10 reps, pt cued for pain free ROM -PPT with march, 1 set of 10 reps, pt requires VC for correct sequencing -Step up and overs, 8 inch step, 2 sets of 5 reps (easy) -Lateral step up and overs, 8 inch step, 2 sets of 4 reps (pt reports SOB) -Lateral stepping 3 laps 10 feet per lap, with GTB around ankles, pt cued for upright posture and core activation   Therapeutic Activity: -Box lifts, 10lbs added, 1 set of 5 reps, pt cued for increased knee flexion, pt cued for core activation -Box transfers, 1 set of 5 reps, no additional weight, from floor to chair height, pt cued for keeping load close to body and avoidance of twisting     01/01/24: STS 10x eccentric control no HHA Standing: - Shoulder extension with RTB with marching 10x 3" theraband CGA - Rows with RTB 10x  - Squat 15 x front of chair cueing for mechanics for increased gluteal activation and reduce anterior aspect of knee pressure - Tandem stance 1x 30", 2x 30" on foam - Vector stance 3x 5" with 1 HHA Seated  - piriformis stretch 3x 30" - Long sitting hamstring 2x 30" Trigger Point Dry Needling  What is Trigger Point Dry Needling (DN)? DN is a physical therapy technique used to treat muscle pain and dysfunction. Specifically, DN helps deactivate muscle trigger points (muscle knots).  A thin filiform needle is used to penetrate the skin and stimulate the underlying trigger point. The goal is for a local twitch response (LTR) to occur and for the trigger point to relax. No medication of any kind is injected during the procedure.   What Does Trigger Point Dry Needling Feel Like?  The procedure feels different for each individual patient. Some patients report that they do not actually feel the needle enter the skin and overall the process is not painful. Very mild bleeding may occur. However, many patients feel a deep  cramping in the muscle in which the needle was inserted. This is the local twitch response.   How Will I feel after the treatment? Soreness is normal, and the onset of soreness may not occur for a few hours. Typically this soreness does not last longer than two days.  Bruising is uncommon, however; ice can be used to decrease any possible bruising.  In rare cases feeling tired or nauseous after the treatment is normal. In addition, your symptoms may get worse before they get better, this period will typically not last longer than 24 hours.   What Can I do After My Treatment? Increase your hydration by drinking more water  for the next 24 hours.  You may place ice or heat on the areas treated that have become sore, however, do not use heat on inflamed or bruised areas. Heat often brings more relief post needling. You can continue your regular activities, but vigorous activity is not recommended initially after the treatment for 24 hours. DN is best combined with other physical therapy such as strengthening, stretching, and other therapies.   What are the complications? While your therapist has had extensive training in minimizing the risks of trigger point dry needling, it is important to understand the risks of any  procedure.  Risks include bleeding, pain, fatigue, hematoma, infection, vertigo, nausea or nerve involvement. Monitor for any changes to your skin or sensation. Contact your therapist or MD with concerns.  A rare but serious complication is a pneumothorax over or near your middle and upper chest and back If you have dry needling in this area, monitor for the following symptoms: Shortness of breath on exertion and/or Difficulty taking a deep breath and/or Chest Pain and/or A dry cough If any of the above symptoms develop, please go to the nearest emergency room or call 911. Tell them you had dry needling over your thorax and report any symptoms you are having. Please follow-up with  your treating therapist after you complete the medical evaluation.    12/26/23: NuStep, seat 6, 6', level 1.  Supine PPT Supine bridges  10x 10x2 w/ LE on physioball Seated forward flexion at mat table,  10x2 forward and to right Squats  10x  10x with hip hinge, addition of dowel across upper back for posture Marching, YTB around knees, no UE support, 2x10 for balance challenge Hip abd, YTB around knees, on foam pad, 2x10, One UE support-->one finger support                                                                                                                                  PATIENT EDUCATION:  Education details: HEP, progressive mobility  Person educated: Patient Education method: Explanation and Demonstration Education comprehension: verbalized understanding  HOME EXERCISE PROGRAM: Access Code: [email or web address] URL: [email or web address] Date: 12/10/2023 Prepared by: Leonard Raker  Exercises - Supine Posterior Pelvic Tilt  - 1 x daily - 7 x weekly - 2 sets - 10 reps - 10s hold - Supine Lower Trunk Rotation  - 1 x daily - 7 x weekly - 2 sets - 10 reps - Supine Piriformis Stretch with Leg Straight  - 1 x daily - 7 x weekly - 2 sets - 4 reps - 15s hold - Clamshell  - 1 x daily - 7 x weekly - 3 sets - 10 reps - Seated Slump Nerve Glide  - 1 x daily - 7 x weekly - 3 sets - 10 reps  -seated piriformis stretch 3x 30"  ASSESSMENT:  CLINICAL IMPRESSION: Patient continues to demonstrate low back pain, decreased LE/core strength, decreased gait quality and balance. Patient also demonstrates decreased endurance with aerobic based exercise during today's session. Patient able to progress dynamic balance and core activation exercises today with steps and box transfers, good performance with verbal cueing. Pt able to complete box lifts and transfers with no reported increase in low back, pt educated on the importance of bending knees, keeping load close to body, and  using LE setup to avoid twisting lumbar spine while under increased load. Patient would continue to benefit from skilled physical therapy for decreased low back pain, increased endurance with functional box transfers, increased LE strength, and improved balance  for improved quality of life, improved performance with ADLs and continued progress towards therapy goals.    Patient is a 83 y.o. female who was seen today for physical therapy evaluation and treatment for referral of low back pain presenting with sxs consistent of lumbar dysfunction impacting L posterior buttock and L LE weakness as compared to R. Demonstrates decreased balance reactions in standing with transfers and gait changes requiring supervision as well as decreased functional core engagement during transfers and gait. Recommend continued skilled physical therapy for improving gait mechanics, functional lumbo pelvic activation and strength and for improving tolerance to transfers / routine ADL performance.   OBJECTIVE IMPAIRMENTS: Abnormal gait, decreased activity tolerance, decreased balance, decreased endurance, decreased mobility, difficulty walking, decreased strength, impaired flexibility, improper body mechanics, postural dysfunction, and pain.   ACTIVITY LIMITATIONS: carrying, bending, sitting, standing, squatting, transfers, and functional activity at end of the day  PARTICIPATION LIMITATIONS: meal prep, cleaning, community activity, and yard work  PERSONAL FACTORS: Age and 1-2 comorbidities: CAD, HTN  are also affecting patient's functional outcome.   REHAB POTENTIAL: Good  CLINICAL DECISION MAKING: Evolving/moderate complexity  EVALUATION COMPLEXITY: Moderate   GOALS: Goals reviewed with patient? Yes  SHORT TERM GOALS: Target date: 12/24/23  Pt will be independent with HEP with compliance for at least 75% of the week.  Baseline: prescribed HEP Goal status: INITIAL  2.  Pt will be able to perform slump test with  negative for increase in sxs down L LE to indicate improved neural mobility to progress with strengthening and mobility interventions.  Baseline: + slump test left Goal status: INITIAL  LONG TERM GOALS: Target date: 01/24/24  Pt will be able to perform FTSTS in <18 s without UE use or increase in pain for demonstrating improved functional strength and endurance.  Baseline: see objective Goal status: INITIAL  2.  Pt will score at <10% disability on ODI for indicating improved functional tolerance to ADL and improved QOL. Baseline:  Goal status: INITIAL  3.  Pt will report <1/10 pain at end of day for indicating improved functional postural and transfer mechanics appropriately transferring load and reducing load on lumbar spine.  Baseline: see objective Goal status: INITIAL   PLAN:  PT FREQUENCY: 2x/week  PT DURATION: 6 weeks  PLANNED INTERVENTIONS: 97110-Therapeutic exercises, 97530- Therapeutic activity, 97112- Neuromuscular re-education, 97535- Self Care, 24401- Manual therapy, [phone]- Gait training, [phone]- Electrical stimulation (unattended), [phone]- Electrical stimulation (manual), Patient/Family education, Balance training, Stair training, Taping, Dry Needling, Joint mobilization, Joint manipulation, and Spinal mobilization.  PLAN FOR NEXT SESSION: Hold additional HEP prescription unless taking something off as pt is overwhelmed. Attempt quadruped exercises/stretches. Pt was encouraged to incorporate 2 rest days each week.  Pt also would like to hold off on dry needling until the end of therapy.  Armond Bertin, PT, DPT [facility] Office: [phone] 9:39 AM, 01/07/24

## 2024-01-09 ENCOUNTER — Encounter (HOSPITAL_COMMUNITY): Payer: Self-pay

## 2024-01-09 ENCOUNTER — Ambulatory Visit (HOSPITAL_COMMUNITY)

## 2024-01-09 DIAGNOSIS — M5442 Lumbago with sciatica, left side: Secondary | ICD-10-CM | POA: Diagnosis not present

## 2024-01-09 NOTE — Therapy (Signed)
 OUTPATIENT PHYSICAL THERAPY THORACOLUMBAR TREATMENT   Patient Name: BRONWYNN BOYARSKY MRN: [id number] DOB:28-Sep-1940, 83 y.o., female Today's Date: 01/09/2024  END OF SESSION:  PT End of Session - 01/09/24 0937     Visit Number 8    Number of Visits 12    Date for PT Re-Evaluation 01/23/24    Authorization Type [employer] HMO/PPO    Progress Note Due on Visit 10    PT Start Time 0846    PT Stop Time 0925    PT Time Calculation (min) 39 min    Activity Tolerance Patient tolerated treatment well    Behavior During Therapy [facility] for tasks assessed/performed                  Past Medical History:  Diagnosis Date   Arthritis    "minor" (10/20/2014)   Coronary artery disease    High cholesterol    Hypertension    Migraine    "visual" (10/20/2014)   Pneumonia ~ 1957   Past Surgical History:  Procedure Laterality Date   CARDIAC CATHETERIZATION  10/20/2014   COLONOSCOPY  09/25/2011   Procedure: COLONOSCOPY;  Surgeon: Suzette Espy, MD;  Location: [facility];  Service: Endoscopy;  Laterality: N/A;  12:15 pm   LEFT HEART CATHETERIZATION WITH CORONARY ANGIOGRAM N/A 10/20/2014   Procedure: LEFT HEART CATHETERIZATION WITH CORONARY ANGIOGRAM;  Surgeon: Odie Benne, MD;  Location: [facility] CATH LAB;  Service: Cardiovascular;  Laterality: N/A;   TONSILLECTOMY     TUBAL LIGATION     Patient Active Problem List   Diagnosis Date Noted   Osteopenia 12/04/2023   Urinary incontinence 12/04/2023   DOE (dyspnea on exertion) 10/21/2014   Abnormal cardiac function test 10/21/2014   Dyslipidemia-statin intol 10/21/2014   PAT (paroxysmal atrial tachycardia) (HCC) 10/21/2014   Coronary artery disease due to lipid rich plaque    Expressive aphasia post cath-felt to be secondary to mirgaine 10/20/2014   Essential hypertension 10/20/2014   Cerebral infarction [facility])     PCP: Artemisa Bile MD  REFERRING PROVIDER: Darrin Emerald, MD  REFERRING DIAG: M54.50 (ICD-10-CM) - Lumbar  pain   Rationale for Evaluation and Treatment: Rehabilitation  THERAPY DIAG:  Left-sided low back pain with left-sided sciatica, unspecified chronicity  ONSET DATE: over last 4 years  SUBJECTIVE:                                                                                                                                                                                           SUBJECTIVE STATEMENT: Pt states she does not have any back pain today. Pt states she has  a busy weekend planned with a lot of traveling for granddaughters graduation and nieces wedding.    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 Yes: NPRS scale: 5/10 Pain location: in the L hip Pain description: aching and spreads across to the buttocks and down the back of the leg sometimes to the foot Aggravating factors: standing or sitting for long periods Relieving factors: resting and sometimes moving around, laying out in her bed  PRECAUTIONS: Fall  RED FLAGS: None   WEIGHT BEARING RESTRICTIONS: No  FALLS:  Has patient fallen in last 6 months? No  LIVING ENVIRONMENT: Lives with: lives with their family and on a farm Lives in: House/apartment Stairs: Yes: Internal: 1 steps; none and External: 0 steps; none Has following equipment at home: Single point cane  OCCUPATION: Retired   PLOF: Independent with basic ADLs and Independent with transfers  PATIENT GOALS: "Would like to get so it doesn't cripple me in the evenings. About suppertime I get to the point to where I can't move. I get these episodes but they don't last and the next day I'm better. When I have to do a lot of bending and moving it  gets me"  NEXT MD VISIT: TBD  OBJECTIVE:  Note: Objective measures were completed at Evaluation unless otherwise noted.  PATIENT SURVEYS:  Modified Oswestry 18/50 - 36%   COGNITION: Overall cognitive status: Within functional limits for tasks assessed     SENSATION: WFL  MUSCLE LENGTH: Hamstrings: Right 35 deg; Left 40 deg  POSTURE: forward head and increased lumbar lordosis  PALPATION: L gluteal crest and hip flexor slight tenderness; paraspinal bilateral TTP slightly  LUMBAR ROM:   AROM eval  Flexion 85%  Extension 70%  Right lateral flexion Increased L pain in posterior hip - 75%  Left lateral flexion WFL  Right rotation   Left rotation    (Blank rows = not tested)  LOWER EXTREMITY ROM:     Passive  Right eval Left eval  Hip flexion    Hip extension    Hip abduction    Hip adduction    Hip internal rotation ~25 ~35  Hip external rotation ~65 ~55  Knee flexion    Knee extension    Ankle dorsiflexion    Ankle plantarflexion    Ankle inversion    Ankle eversion     (Blank rows = not tested)  LOWER EXTREMITY MMT:    MMT Right eval Left eval  Hip flexion 4-/5 3+/5  Hip extension    Hip abduction 3+/5 3+/5  Hip adduction    Hip internal rotation    Hip external rotation    Knee flexion 4/5 3+/5  Knee extension 4/5 4-/5  Ankle dorsiflexion 4/5 4-/5  Ankle plantarflexion 4/5 4-/5  Ankle inversion    Ankle eversion     (Blank rows = not tested)  LUMBAR SPECIAL TESTS:  Straight leg raise test: Negative, Slump test: Positive, and FABER test: Negative  FUNCTIONAL TESTS:  5 times sit to stand: 23s 30 seconds chair stand test 6 reps without use of hands SLS: L: 7s  R: 23.61s  GAIT: Distance walked: nt Assistive device utilized: None  Level of assistance: Complete Independence Comments: trendelenburg, decreased foot clearance, decreased thoracic rotation  TREATMENT DATE:  01/09/2024  Therapeutic Exercise: -Treadmill, 2.0, Grade one incline, 4  minutes, SOB noted -5# KB walks, 90 feet per lap, 1 lap per arm, pt cued for core activation and level shoulders  -step ups, 6 inch step, 3 sets of 10 reps, pt cued for increased speed and decreased ROM (pt reports SOB) -Lateral stepping 2 laps 10 feet per lap, with GTB around ankles, pt cued for upright posture and core activation -Childs pose to prone pressup, 1 set of 8 reps, pt reports good stretch, no pain.  Therapeutic Activity: -Quadruped 2 sets of 10 bird dogs for rising from floor training, pt cued for increased core stabilization -STS with tidal tank, 1 set of 8 reps, pt cued for upright posture     01/03/2024  Therapeutic Exercise: -Single knee to chest, 2 reps, 30 second holds, , pt cued for pain free ROM -LTR hooklying, 2 sets of 10 reps, pt cued for pain free ROM -PPT with march, 1 set of 10 reps, pt requires VC for correct sequencing -Step up and overs, 8 inch step, 2 sets of 5 reps (easy) -Lateral step up and overs, 8 inch step, 2 sets of 4 reps (pt reports SOB) -Lateral stepping 3 laps 10 feet per lap, with GTB around ankles, pt cued for upright posture and core activation   Therapeutic Activity: -Box lifts, 10lbs added, 1 set of 5 reps, pt cued for increased knee flexion, pt cued for core activation -Box transfers, 1 set of 5 reps, no additional weight, from floor to chair height, pt cued for keeping load close to body and avoidance of twisting     01/01/24: STS 10x eccentric control no HHA Standing: - Shoulder extension with RTB with marching 10x 3" theraband CGA - Rows with RTB 10x  - Squat 15 x front of chair cueing for mechanics for increased gluteal activation and reduce anterior aspect of knee pressure - Tandem stance 1x 30", 2x 30" on foam - Vector stance 3x 5" with 1 HHA Seated  - piriformis stretch 3x 30" - Long sitting hamstring 2x 30" Trigger Point Dry Needling  What is Trigger Point Dry Needling (DN)? DN is a physical therapy technique used to  treat muscle pain and dysfunction. Specifically, DN helps deactivate muscle trigger points (muscle knots).  A thin filiform needle is used to penetrate the skin and stimulate the underlying trigger point. The goal is for a local twitch response (LTR) to occur and for the trigger point to relax. No medication of any kind is injected during the procedure.   What Does Trigger Point Dry Needling Feel Like?  The procedure feels different for each individual patient. Some patients report that they do not actually feel the needle enter the skin and overall the process is not painful. Very mild bleeding may occur. However, many patients feel a deep cramping in the muscle in which the needle was inserted. This is the local twitch response.   How Will I feel after the treatment? Soreness is normal, and the onset of soreness may not occur for a few hours. Typically this soreness does not last longer than two days.  Bruising is uncommon, however; ice can be used to decrease any possible bruising.  In rare cases feeling tired or nauseous after the treatment is normal. In addition, your symptoms may get worse before they get better, this period will typically not last longer  than 24 hours.   What Can I do After My Treatment? Increase your hydration by drinking more water  for the next 24 hours.  You may place ice or heat on the areas treated that have become sore, however, do not use heat on inflamed or bruised areas. Heat often brings more relief post needling. You can continue your regular activities, but vigorous activity is not recommended initially after the treatment for 24 hours. DN is best combined with other physical therapy such as strengthening, stretching, and other therapies.   What are the complications? While your therapist has had extensive training in minimizing the risks of trigger point dry needling, it is important to understand the risks of any procedure.  Risks include bleeding, pain,  fatigue, hematoma, infection, vertigo, nausea or nerve involvement. Monitor for any changes to your skin or sensation. Contact your therapist or MD with concerns.  A rare but serious complication is a pneumothorax over or near your middle and upper chest and back If you have dry needling in this area, monitor for the following symptoms: Shortness of breath on exertion and/or Difficulty taking a deep breath and/or Chest Pain and/or A dry cough If any of the above symptoms develop, please go to the nearest emergency room or call 911. Tell them you had dry needling over your thorax and report any symptoms you are having. Please follow-up with your treating therapist after you complete the medical evaluation.    12/26/23: NuStep, seat 6, 6', level 1.  Supine PPT Supine bridges  10x 10x2 w/ LE on physioball Seated forward flexion at mat table,  10x2 forward and to right Squats  10x  10x with hip hinge, addition of dowel across upper back for posture Marching, YTB around knees, no UE support, 2x10 for balance challenge Hip abd, YTB around knees, on foam pad, 2x10, One UE support-->one finger support                                                                                                                                  PATIENT EDUCATION:  Education details: HEP, progressive mobility  Person educated: Patient Education method: Explanation and Demonstration Education comprehension: verbalized understanding  HOME EXERCISE PROGRAM: Access Code: [email or web address] URL: [email or web address] Date: 12/10/2023 Prepared by: Leonard Raker  Exercises - Supine Posterior Pelvic Tilt  - 1 x daily - 7 x weekly - 2 sets - 10 reps - 10s hold - Supine Lower Trunk Rotation  - 1 x daily - 7 x weekly - 2 sets - 10 reps - Supine Piriformis Stretch with Leg Straight  - 1 x daily - 7 x weekly - 2 sets - 4 reps - 15s hold - Clamshell  - 1 x daily - 7 x weekly - 3 sets - 10 reps - Seated  Slump Nerve Glide  - 1 x daily - 7 x weekly - 3 sets - 10 reps  -seated piriformis stretch 3x  30"  ASSESSMENT:  CLINICAL IMPRESSION: Patient continues to demonstrate improved core/LE strength, decreased back pain, impaired balance and decreased endurance. Patient also demonstrates decreased endurance training with step ups and treadmill exercises, SOB noted during today's session. Patient able to progress dynamic balance and core activation exercises today with unilateral farmer carries and quadruped activities, good performance with verbal cueing. Patient would continue to benefit from skilled physical therapy for increased endurance with ambulation, increased core/LE strength, and improved balance for improved quality of life, improved performance with ADL and continued progress towards therapy goals.     Patient is a 83 y.o. female who was seen today for physical therapy evaluation and treatment for referral of low back pain presenting with sxs consistent of lumbar dysfunction impacting L posterior buttock and L LE weakness as compared to R. Demonstrates decreased balance reactions in standing with transfers and gait changes requiring supervision as well as decreased functional core engagement during transfers and gait. Recommend continued skilled physical therapy for improving gait mechanics, functional lumbo pelvic activation and strength and for improving tolerance to transfers / routine ADL performance.   OBJECTIVE IMPAIRMENTS: Abnormal gait, decreased activity tolerance, decreased balance, decreased endurance, decreased mobility, difficulty walking, decreased strength, impaired flexibility, improper body mechanics, postural dysfunction, and pain.   ACTIVITY LIMITATIONS: carrying, bending, sitting, standing, squatting, transfers, and functional activity at end of the day  PARTICIPATION LIMITATIONS: meal prep, cleaning, community activity, and yard work  PERSONAL FACTORS: Age and 1-2  comorbidities: CAD, HTN are also affecting patient's functional outcome.   REHAB POTENTIAL: Good  CLINICAL DECISION MAKING: Evolving/moderate complexity  EVALUATION COMPLEXITY: Moderate   GOALS: Goals reviewed with patient? Yes  SHORT TERM GOALS: Target date: 12/24/23  Pt will be independent with HEP with compliance for at least 75% of the week.  Baseline: prescribed HEP Goal status: INITIAL  2.  Pt will be able to perform slump test with negative for increase in sxs down L LE to indicate improved neural mobility to progress with strengthening and mobility interventions.  Baseline: + slump test left Goal status: INITIAL  LONG TERM GOALS: Target date: 01/24/24  Pt will be able to perform FTSTS in <18 s without UE use or increase in pain for demonstrating improved functional strength and endurance.  Baseline: see objective Goal status: INITIAL  2.  Pt will score at <10% disability on ODI for indicating improved functional tolerance to ADL and improved QOL. Baseline:  Goal status: INITIAL  3.  Pt will report <1/10 pain at end of day for indicating improved functional postural and transfer mechanics appropriately transferring load and reducing load on lumbar spine.  Baseline: see objective Goal status: INITIAL   PLAN:  PT FREQUENCY: 2x/week  PT DURATION: 6 weeks  PLANNED INTERVENTIONS: 97110-Therapeutic exercises, 97530- Therapeutic activity, 97112- Neuromuscular re-education, 97535- Self Care, 16109- Manual therapy, [phone]- Gait training, [phone]- Electrical stimulation (unattended), [phone]- Electrical stimulation (manual), Patient/Family education, Balance training, Stair training, Taping, Dry Needling, Joint mobilization, Joint manipulation, and Spinal mobilization.  PLAN FOR NEXT SESSION: Hold additional HEP prescription unless taking something off as pt is overwhelmed. Attempt quadruped exercises/stretches. Pt was encouraged to incorporate 2 rest days each week.  Pt also would  like to hold off on dry needling until the end of therapy.  Armond Bertin, PT, DPT [facility] Office: [phone] 9:40 AM, 01/09/24

## 2024-01-13 ENCOUNTER — Ambulatory Visit (HOSPITAL_COMMUNITY): Admitting: Physical Therapy

## 2024-01-13 DIAGNOSIS — M5442 Lumbago with sciatica, left side: Secondary | ICD-10-CM

## 2024-01-13 NOTE — Therapy (Signed)
 OUTPATIENT PHYSICAL THERAPY THORACOLUMBAR TREATMENT   Patient Name: Anna Hicks MRN: [id number] DOB:November 13, 1940, 83 y.o., female Today's Date: 01/13/2024  END OF SESSION:  PT End of Session - 01/13/24 1518     Visit Number 9    Number of Visits 12    Date for PT Re-Evaluation 01/23/24    Authorization Type [employer] HMO/PPO    Progress Note Due on Visit 10    PT Start Time 0934    PT Stop Time 1015    PT Time Calculation (min) 41 min    Activity Tolerance Patient tolerated treatment well    Behavior During Therapy [facility] for tasks assessed/performed                   Past Medical History:  Diagnosis Date   Arthritis    "minor" (10/20/2014)   Coronary artery disease    High cholesterol    Hypertension    Migraine    "visual" (10/20/2014)   Pneumonia ~ 1957   Past Surgical History:  Procedure Laterality Date   CARDIAC CATHETERIZATION  10/20/2014   COLONOSCOPY  09/25/2011   Procedure: COLONOSCOPY;  Surgeon: Suzette Espy, MD;  Location: [facility];  Service: Endoscopy;  Laterality: N/A;  12:15 pm   LEFT HEART CATHETERIZATION WITH CORONARY ANGIOGRAM N/A 10/20/2014   Procedure: LEFT HEART CATHETERIZATION WITH CORONARY ANGIOGRAM;  Surgeon: Odie Benne, MD;  Location: [facility] CATH LAB;  Service: Cardiovascular;  Laterality: N/A;   TONSILLECTOMY     TUBAL LIGATION     Patient Active Problem List   Diagnosis Date Noted   Osteopenia 12/04/2023   Urinary incontinence 12/04/2023   DOE (dyspnea on exertion) 10/21/2014   Abnormal cardiac function test 10/21/2014   Dyslipidemia-statin intol 10/21/2014   PAT (paroxysmal atrial tachycardia) (HCC) 10/21/2014   Coronary artery disease due to lipid rich plaque    Expressive aphasia post cath-felt to be secondary to mirgaine 10/20/2014   Essential hypertension 10/20/2014   Cerebral infarction [facility])     PCP: Artemisa Bile MD  REFERRING PROVIDER: Darrin Emerald, MD  REFERRING DIAG: M54.50 (ICD-10-CM) -  Lumbar pain   Rationale for Evaluation and Treatment: Rehabilitation  THERAPY DIAG:  Left-sided low back pain with left-sided sciatica, unspecified chronicity  ONSET DATE: over last 4 years  SUBJECTIVE:                                                                                                                                                                                           SUBJECTIVE STATEMENT: Pt states she feels 80% improved. States she went to her grand daughters  graduation at [employer] and did a lot of walking, had no problem. States she had gotten to the point she was walking with a cane at night but is no longer doing that.     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 Yes: NPRS scale: 5/10 Pain location: in the L hip Pain description: aching and spreads across to the buttocks and down the back of the leg sometimes to the foot Aggravating factors: standing or sitting for long periods Relieving factors: resting and sometimes moving around, laying out in her bed  PRECAUTIONS: Fall  RED FLAGS: None   WEIGHT BEARING RESTRICTIONS: No  FALLS:  Has patient fallen in last 6 months? No  LIVING ENVIRONMENT: Lives with: lives with their family and on a farm Lives in: House/apartment Stairs: Yes: Internal: 1 steps; none and External: 0 steps; none Has following equipment at home: Single point cane  OCCUPATION: Retired   PLOF: Independent with basic ADLs and Independent with transfers  PATIENT GOALS: "Would like to get so it doesn't cripple me in the evenings. About suppertime I get to the point to where I can't move. I get these episodes but they don't last  and the next day I'm better. When I have to do a lot of bending and moving it gets me"  NEXT MD VISIT: TBD  OBJECTIVE:  Note: Objective measures were completed at Evaluation unless otherwise noted.  PATIENT SURVEYS:  Modified Oswestry 18/50 - 36%   COGNITION: Overall cognitive status: Within functional limits for tasks assessed     SENSATION: WFL  MUSCLE LENGTH: Hamstrings: Right 35 deg; Left 40 deg  POSTURE: forward head and increased lumbar lordosis  PALPATION: L gluteal crest and hip flexor slight tenderness; paraspinal bilateral TTP slightly  LUMBAR ROM:   AROM eval  Flexion 85%  Extension 70%  Right lateral flexion Increased L pain in posterior hip - 75%  Left lateral flexion WFL  Right rotation   Left rotation    (Blank rows = not tested)  LOWER EXTREMITY ROM:     Passive  Right eval Left eval  Hip flexion    Hip extension    Hip abduction    Hip adduction    Hip internal rotation ~25 ~35  Hip external rotation ~65 ~55  Knee flexion    Knee extension    Ankle dorsiflexion    Ankle plantarflexion    Ankle inversion    Ankle eversion     (Blank rows = not tested)  LOWER EXTREMITY MMT:    MMT Right eval Left eval  Hip flexion 4-/5 3+/5  Hip extension    Hip abduction 3+/5 3+/5  Hip adduction    Hip internal rotation    Hip external rotation    Knee flexion 4/5 3+/5  Knee extension 4/5 4-/5  Ankle dorsiflexion 4/5 4-/5  Ankle plantarflexion 4/5 4-/5  Ankle inversion    Ankle eversion     (Blank rows = not tested)  LUMBAR SPECIAL TESTS:  Straight leg raise test: Negative, Slump test: Positive, and FABER test: Negative  FUNCTIONAL TESTS:  5 times sit to stand: 23s 30 seconds chair stand test 6 reps  without use of hands SLS: L: 7s  R: 23.61s  GAIT: Distance walked: nt Assistive device utilized: None Level of assistance: Complete Independence Comments: trendelenburg, decreased foot clearance, decreased thoracic rotation  TREATMENT  DATE:  01/13/24 Standing:  6" forward step ups with power ups 2X10 each LE with 1 UE assist  KB carry 5# 20 foot line 3RT each UE carry  Lateral stepping GTB 20 foot line 3RT (120 foot total)  SLS with 2# theracane fwd lifts 10X each LE  SLS with 2# theracane rotations 10X each LE  01/09/2024  Therapeutic Exercise: -Treadmill, 2.0, Grade one incline, 4 minutes, SOB noted -5# KB walks, 90 feet per lap, 1 lap per arm, pt cued for core activation and level shoulders  -step ups, 6 inch step, 3 sets of 10 reps, pt cued for increased speed and decreased ROM (pt reports SOB) -Lateral stepping 2 laps 10 feet per lap, with GTB around ankles, pt cued for upright posture and core activation -Childs pose to prone pressup, 1 set of 8 reps, pt reports good stretch, no pain.  Therapeutic Activity: -Quadruped 2 sets of 10 bird dogs for rising from floor training, pt cued for increased core stabilization -STS with tidal tank, 1 set of 8 reps, pt cued for upright posture     01/03/2024  Therapeutic Exercise: -Single knee to chest, 2 reps, 30 second holds, , pt cued for pain free ROM -LTR hooklying, 2 sets of 10 reps, pt cued for pain free ROM -PPT with march, 1 set of 10 reps, pt requires VC for correct sequencing -Step up and overs, 8 inch step, 2 sets of 5 reps (easy) -Lateral step up and overs, 8 inch step, 2 sets of 4 reps (pt reports SOB) -Lateral stepping 3 laps 10 feet per lap, with GTB around ankles, pt cued for upright posture and core activation   Therapeutic Activity: -Box lifts, 10lbs added, 1 set of 5 reps, pt cued for increased knee flexion, pt cued for core activation -Box transfers, 1 set of 5 reps, no additional weight, from floor to chair height, pt cued for keeping load close to body and avoidance of twisting     4                                                                                                                               PATIENT EDUCATION:  Education details:  HEP, progressive mobility  Person educated: Patient Education method: Explanation and Demonstration Education comprehension: verbalized understanding  HOME EXERCISE PROGRAM: Access Code: [email or web address] URL: [email or web address] Date: 12/10/2023 Prepared by: Leonard Raker  Exercises - Supine Posterior Pelvic Tilt  - 1 x daily - 7 x weekly - 2 sets - 10 reps - 10s hold - Supine Lower Trunk Rotation  - 1 x daily - 7 x weekly - 2 sets - 10 reps - Supine Piriformis Stretch with Leg Straight  - 1 x daily - 7 x  weekly - 2 sets - 4 reps - 15s hold - Clamshell  - 1 x daily - 7 x weekly - 3 sets - 10 reps - Seated Slump Nerve Glide  - 1 x daily - 7 x weekly - 3 sets - 10 reps  -seated piriformis stretch 3x 30"  ASSESSMENT:  CLINICAL IMPRESSION: Continued to focus on improving LE functional strengthening and stability. Pt with minimal fatigue today only requiring 2 rest breaks during session.  Single leg rotation task most challenging for patient to complete today.  Added sit to stands using tidal tank with good control possessed. Minimal cues needed today for form. Pt will continue to benefit from skilled therapy to progress towards goals for improved quality of life and performance with ADL's.   Patient is a 83 y.o. female who was seen today for physical therapy evaluation and treatment for referral of low back pain presenting with sxs consistent of lumbar dysfunction impacting L posterior buttock and L LE weakness as compared to R. Demonstrates decreased balance reactions in standing with transfers and gait changes requiring supervision as well as decreased functional core engagement during transfers and gait. Recommend continued skilled physical therapy for improving gait mechanics, functional lumbo pelvic activation and strength and for improving tolerance to transfers / routine ADL performance.   OBJECTIVE IMPAIRMENTS: Abnormal gait, decreased activity tolerance, decreased balance,  decreased endurance, decreased mobility, difficulty walking, decreased strength, impaired flexibility, improper body mechanics, postural dysfunction, and pain.   ACTIVITY LIMITATIONS: carrying, bending, sitting, standing, squatting, transfers, and functional activity at end of the day  PARTICIPATION LIMITATIONS: meal prep, cleaning, community activity, and yard work  PERSONAL FACTORS: Age and 1-2 comorbidities: CAD, HTN are also affecting patient's functional outcome.   REHAB POTENTIAL: Good  CLINICAL DECISION MAKING: Evolving/moderate complexity  EVALUATION COMPLEXITY: Moderate   GOALS: Goals reviewed with patient? Yes  SHORT TERM GOALS: Target date: 12/24/23  Pt will be independent with HEP with compliance for at least 75% of the week.  Baseline: prescribed HEP Goal status: INITIAL  2.  Pt will be able to perform slump test with negative for increase in sxs down L LE to indicate improved neural mobility to progress with strengthening and mobility interventions.  Baseline: + slump test left Goal status: INITIAL  LONG TERM GOALS: Target date: 01/24/24  Pt will be able to perform FTSTS in <18 s without UE use or increase in pain for demonstrating improved functional strength and endurance.  Baseline: see objective Goal status: INITIAL  2.  Pt will score at <10% disability on ODI for indicating improved functional tolerance to ADL and improved QOL. Baseline:  Goal status: INITIAL  3.  Pt will report <1/10 pain at end of day for indicating improved functional postural and transfer mechanics appropriately transferring load and reducing load on lumbar spine.  Baseline: see objective Goal status: INITIAL   PLAN:  PT FREQUENCY: 2x/week  PT DURATION: 6 weeks  PLANNED INTERVENTIONS: 97110-Therapeutic exercises, 97530- Therapeutic activity, 97112- Neuromuscular re-education, 97535- Self Care, 16109- Manual therapy, [phone]- Gait training, [phone]- Electrical stimulation (unattended),  [phone]- Electrical stimulation (manual), Patient/Family education, Balance training, Stair training, Taping, Dry Needling, Joint mobilization, Joint manipulation, and Spinal mobilization.  PLAN FOR NEXT SESSION: Hold additional HEP prescription unless taking something off as pt is overwhelmed. Attempt quadruped exercises/stretches. Pt was encouraged to incorporate 2 rest days each week.  Pt also would like to hold off on dry needling until the end of therapy. Complete 10th visit progress note next  session.  Lorenso Romance, PTA/CLT [facility] Ph: [phone]  3:19 PM, 01/13/24

## 2024-01-16 ENCOUNTER — Ambulatory Visit (HOSPITAL_COMMUNITY): Admitting: Physical Therapy

## 2024-01-16 ENCOUNTER — Encounter (HOSPITAL_COMMUNITY): Admitting: Physical Therapy

## 2024-01-16 DIAGNOSIS — M5442 Lumbago with sciatica, left side: Secondary | ICD-10-CM

## 2024-01-16 NOTE — Therapy (Signed)
 OUTPATIENT PHYSICAL THERAPY THORACOLUMBAR TREATMENT   Patient Name: JENIFFER KOSTERS MRN: [id number] DOB:05/20/1941, 83 y.o., female Today's Date: 01/16/2024  END OF SESSION:  PT End of Session - 01/16/24 1432     Visit Number 10    Number of Visits 12    Date for PT Re-Evaluation 01/23/24    Authorization Type [employer] HMO/PPO    Progress Note Due on Visit 20    PT Start Time 1433    PT Stop Time 1515    PT Time Calculation (min) 42 min    Activity Tolerance Patient tolerated treatment well    Behavior During Therapy WFL for tasks assessed/performed                Past Medical History:  Diagnosis Date   Arthritis    "minor" (10/20/2014)   Coronary artery disease    High cholesterol    Hypertension    Migraine    "visual" (10/20/2014)   Pneumonia ~ 1957   Past Surgical History:  Procedure Laterality Date   CARDIAC CATHETERIZATION  10/20/2014   COLONOSCOPY  09/25/2011   Procedure: COLONOSCOPY;  Surgeon: Suzette Espy, MD;  Location: [facility];  Service: Endoscopy;  Laterality: N/A;  12:15 pm   LEFT HEART CATHETERIZATION WITH CORONARY ANGIOGRAM N/A 10/20/2014   Procedure: LEFT HEART CATHETERIZATION WITH CORONARY ANGIOGRAM;  Surgeon: Odie Benne, MD;  Location: [facility] CATH LAB;  Service: Cardiovascular;  Laterality: N/A;   TONSILLECTOMY     TUBAL LIGATION     Patient Active Problem List   Diagnosis Date Noted   Osteopenia 12/04/2023   Urinary incontinence 12/04/2023   DOE (dyspnea on exertion) 10/21/2014   Abnormal cardiac function test 10/21/2014   Dyslipidemia-statin intol 10/21/2014   PAT (paroxysmal atrial tachycardia) (HCC) 10/21/2014   Coronary artery disease due to lipid rich plaque    Expressive aphasia post cath-felt to be secondary to mirgaine 10/20/2014   Essential hypertension 10/20/2014   Cerebral infarction [facility])     PCP: Artemisa Bile MD  REFERRING PROVIDER: Darrin Emerald, MD  REFERRING DIAG: M54.50 (ICD-10-CM) - Lumbar  pain   Rationale for Evaluation and Treatment: Rehabilitation  THERAPY DIAG:  Left-sided low back pain with left-sided sciatica, unspecified chronicity  ONSET DATE: over last 4 years  SUBJECTIVE:                                                                                                                                                                                           SUBJECTIVE STATEMENT: Pt states she feels 80% improved. Reports she went to the [facility] this morning and walked  over half a mile.  States she had gotten to the point she was walking with a cane at night but is no longer doing that. States her pain is now centralized and no longer having pains down her legs.   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 Yes: NPRS scale: 5/10 Pain location: in the L hip Pain description: aching and spreads across to the buttocks and down the back of the leg sometimes to the foot Aggravating factors: standing or sitting for long periods Relieving factors: resting and sometimes moving around, laying out in her bed  PRECAUTIONS: Fall  RED FLAGS: None   WEIGHT BEARING RESTRICTIONS: No  FALLS:  Has patient fallen in last 6 months? No  LIVING ENVIRONMENT: Lives with: lives with their family and on a farm Lives in: House/apartment Stairs: Yes: Internal: 1 steps; none and External: 0 steps; none Has following equipment at home: Single point cane  OCCUPATION: Retired   PLOF: Independent with basic ADLs and Independent with transfers  PATIENT GOALS: "Would like to get so it doesn't cripple me in the evenings. About suppertime I get to the point to where I can't move. I  get these episodes but they don't last and the next day I'm better. When I have to do a lot of bending and moving it gets me"  NEXT MD VISIT: TBD  OBJECTIVE:  Note: Objective measures were completed at Evaluation unless otherwise noted.  PATIENT SURVEYS:  Modified Oswestry 01/16/24:  15 points or 30 percent (was 18/50 - 36% at evaluation)   COGNITION: Overall cognitive status: Within functional limits for tasks assessed     SENSATION: WFL  MUSCLE LENGTH: Hamstrings: Right 35 deg; Left 40 deg  POSTURE: forward head and increased lumbar lordosis  PALPATION: L gluteal crest and hip flexor slight tenderness; paraspinal bilateral TTP slightly  LUMBAR ROM:   AROM eval 01/16/24  Flexion 85% WNL  Extension 70% WNL  Right lateral flexion Increased L pain in posterior hip - 75% Fingertip to knee  Left lateral flexion WFL Finger to knee  Right rotation    Left rotation     (Blank rows = not tested)  LOWER EXTREMITY ROM:     Passive  Right eval Left eval  Hip flexion    Hip extension    Hip abduction    Hip adduction    Hip internal rotation ~25 ~35  Hip external rotation ~65 ~55  Knee flexion    Knee extension    Ankle dorsiflexion    Ankle plantarflexion    Ankle inversion    Ankle eversion     (Blank rows = not tested)  LOWER EXTREMITY MMT:    MMT Right eval Left eval Right 01/16/24 Left 01/16/24  Hip flexion 4-/5 3+/5 4- 4-  Hip extension      Hip abduction 3+/5 3+/5 4- 4-  Hip adduction      Hip internal rotation      Hip external rotation      Knee flexion 4/5 3+/5 4 + 4  Knee extension 4/5 4-/5 4 4   Ankle dorsiflexion 4/5 4-/5 4 4   Ankle plantarflexion 4/5 4-/5 4 4  Ankle inversion      Ankle eversion       (Blank rows = not tested)  LUMBAR SPECIAL TESTS:  Straight leg raise test: Negative, Slump test: Positive, and FABER test: Negative  FUNCTIONAL TESTS:  5 times sit to stand: 01/16/24: 12.44 sec (was 23sec at evaluation) 30 seconds chair stand  test 6 reps without use of hands SLS: L: 7s  R: 23.61s  GAIT: Distance walked: nt Assistive device utilized: None Level of assistance: Complete Independence Comments: trendelenburg, decreased foot clearance, decreased thoracic rotation  TREATMENT DATE:  01/16/24 Progress note:  5 times sit to stand: 01/16/24: 12.44 sec (was 23sec at evaluation) SLS: Lt: 18" Rt: 24" (was L: 7s  R: 23.61s) Modified Oswestry 01/16/24:  15 points or 30 percent (was 18/50 - 36% at evaluation)  MMT see above ROM see above  01/13/24 Standing:  6" forward step ups with power ups 2X10 each LE with 1 UE assist  KB carry 5# 20 foot line 3RT each UE carry  Lateral stepping GTB 20 foot line 3RT (120 foot total)  SLS with 2# theracane fwd lifts 10X each LE  SLS with 2# theracane rotations 10X each LE  01/09/2024  Therapeutic Exercise: -Treadmill, 2.0, Grade one incline, 4 minutes, SOB noted -5# KB walks, 90 feet per lap, 1 lap per arm, pt cued for core activation and level shoulders  -step ups, 6 inch step, 3 sets of 10 reps, pt cued for increased speed and decreased ROM (pt reports SOB) -Lateral stepping 2 laps 10 feet per lap, with GTB around ankles, pt cued for upright posture and core activation -Childs pose to prone pressup, 1 set of 8 reps, pt reports good stretch, no pain.  Therapeutic Activity: -Quadruped 2 sets of 10 bird dogs for rising from floor training, pt cued for increased core stabilization -STS with tidal tank, 1 set of 8 reps, pt cued for upright posture   01/03/2024  Therapeutic Exercise: -Single knee to chest, 2 reps, 30 second holds, , pt cued for pain free ROM -LTR hooklying, 2 sets of 10 reps, pt cued for pain free ROM -PPT with march, 1 set of 10 reps, pt requires VC for correct sequencing -Step up and overs, 8 inch step, 2 sets of 5 reps (easy) -Lateral step up and overs, 8 inch step, 2 sets of 4 reps (pt reports SOB) -Lateral stepping 3 laps 10 feet per lap, with GTB around ankles,  pt cued for upright posture and core activation   Therapeutic Activity: -Box lifts, 10lbs added, 1 set of 5 reps, pt cued for increased knee flexion, pt cued for core activation -Box transfers, 1 set of 5 reps, no additional weight, from floor to chair height, pt cued for keeping load close to body and avoidance of twisting  PATIENT EDUCATION:  Education details: HEP, progressive mobility  Person educated: Patient Education method: Medical illustrator Education comprehension: verbalized understanding  HOME EXERCISE PROGRAM: Access Code: [email or web address] URL: [email or web address] Date: 12/10/2023 Prepared by: Leonard Raker  Exercises - Supine Posterior Pelvic Tilt  - 1 x daily - 7 x weekly - 2 sets - 10 reps - 10s hold - Supine Lower Trunk Rotation  - 1 x daily - 7 x weekly - 2 sets - 10 reps - Supine Piriformis Stretch with Leg Straight  - 1 x daily - 7 x weekly - 2 sets - 4 reps - 15s hold - Clamshell  - 1 x daily - 7 x weekly - 3 sets - 10 reps - Seated Slump Nerve Glide  - 1 x daily - 7 x weekly - 3 sets - 10 reps -seated piriformis stretch 3x 30"  Date: 01/16/2024 Prepared by: Vickii Grand Exercises - Supine Figure 4 Piriformis Stretch  - 1 x daily - 7 x weekly - 1 sets - 6 reps - 10s hold - Standing Lumbar Spine Flexion Stretch Counter  - 1 x daily - 7 x weekly - 3 sets - 10 reps - Side Stepping with Resistance at Ankles  - 1 x daily - 7 x weekly - 2 sets - 10 reps - Squat with Chair Touch  - 2 x daily - 7 x weekly - 1 sets - 10 reps - Seated Piriformis Stretch with Trunk Bend  - 2 x daily - 7 x weekly - 1 sets - 3 reps - 30" hold - Seated Table Hamstring Stretch  - 2 x daily - 7 x weekly - 1 sets - 3 reps - 30" hold - Standing Hip Abduction with Anterior Support  - 1 x daily - 7 x weekly - 2 sets - 10 reps - Standing Hip Extension with  Counter Support  - 1 x daily - 7 x weekly - 2 sets - 10 reps - Tandem Stance with Support  - 1 x daily - 7 x weekly - 3 sets - 3 reps - 30 sec hold - Standing Single Leg Heel Raise  - 1 x daily - 7 x weekly - 2 sets - 3 reps  ASSESSMENT:  CLINICAL IMPRESSION: 10th visit progress note completed this session.  Pt has made good functional gains and overall improved pain which has centralized without radiculopathy.  Pt has made slight improvements in LE strength with weakness being remaining limitation.  Pt has met most goals at this point and would like to complete last 2 sessions to fine tune HEP and work on remaining deficits.  Pt does not have a basic LE strengthening program for hips and will add these next session (pt requested standing). Pt will continue to benefit from skilled therapy to progress towards goals for improved quality of life and performance with ADL's.   Patient is a 83 y.o. female who was seen today for physical therapy evaluation and treatment for referral of low back pain presenting with sxs consistent of lumbar dysfunction impacting L posterior buttock and L LE weakness as compared to R. Demonstrates decreased balance reactions in standing with transfers and gait changes requiring supervision as well as decreased functional core engagement during transfers and gait. Recommend continued skilled physical therapy for improving gait mechanics, functional lumbo pelvic activation and strength and for improving tolerance to transfers / routine ADL performance.   OBJECTIVE IMPAIRMENTS: Abnormal gait, decreased activity tolerance, decreased balance, decreased endurance, decreased mobility, difficulty  walking, decreased strength, impaired flexibility, improper body mechanics, postural dysfunction, and pain.   ACTIVITY LIMITATIONS: carrying, bending, sitting, standing, squatting, transfers, and functional activity at end of the day  PARTICIPATION LIMITATIONS: meal prep, cleaning, community  activity, and yard work  PERSONAL FACTORS: Age and 1-2 comorbidities: CAD, HTN are also affecting patient's functional outcome.   REHAB POTENTIAL: Good  CLINICAL DECISION MAKING: Evolving/moderate complexity  EVALUATION COMPLEXITY: Moderate   GOALS: Goals reviewed with patient? Yes  SHORT TERM GOALS: Target date: 12/24/23  Pt will be independent with HEP with compliance for at least 75% of the week.  Baseline: prescribed HEP Goal status: MET  2.  Pt will be able to perform slump test with negative for increase in sxs down L LE to indicate improved neural mobility to progress with strengthening and mobility interventions.  Baseline: + slump test left Goal status: MET  LONG TERM GOALS: Target date: 01/24/24  Pt will be able to perform FTSTS in <18 s without UE use or increase in pain for demonstrating improved functional strength and endurance.  Baseline: see objective Goal status: MET  2.  Pt will score at <10% disability on ODI for indicating improved functional tolerance to ADL and improved QOL. Baseline:  Goal status: NOT MET   3.  Pt will report <1/10 pain at end of day for indicating improved functional postural and transfer mechanics appropriately transferring load and reducing load on lumbar spine.  Baseline: see objective Goal status: MET (depends on what she does during the day)   PLAN:  PT FREQUENCY: 2x/week  PT DURATION: 6 weeks  PLANNED INTERVENTIONS: 97110-Therapeutic exercises, 97530- Therapeutic activity, 97112- Neuromuscular re-education, 97535- Self Care, 82956- Manual therapy, [phone]- Gait training, [phone]- Electrical stimulation (unattended), [phone]- Electrical stimulation (manual), Patient/Family education, Balance training, Stair training, Taping, Dry Needling, Joint mobilization, Joint manipulation, and Spinal mobilization.  PLAN FOR NEXT SESSION: Update HEP to include general hip strengthening (hip abd/ext) as well as tandem and SLS for balance.  Discharge in 2 more sessions per discussion; pt has returned to [facility].  Lorenso Romance, PTA/CLT [facility] Ph: [phone]  2:33 PM, 01/16/24  Armond Bertin, PT, DPT [facility] Office: [phone] 11:16 AM, 01/17/24

## 2024-01-20 ENCOUNTER — Ambulatory Visit (HOSPITAL_COMMUNITY): Admitting: Physical Therapy

## 2024-01-20 DIAGNOSIS — M5442 Lumbago with sciatica, left side: Secondary | ICD-10-CM

## 2024-01-20 NOTE — Therapy (Signed)
 OUTPATIENT PHYSICAL THERAPY THORACOLUMBAR TREATMENT   Patient Name: Anna Hicks MRN: [id number] DOB:May 19, 1941, 83 y.o., female Today's Date: 01/20/2024  END OF SESSION:  PT End of Session - 01/20/24 0933     Visit Number 11    Number of Visits 12    Date for PT Re-Evaluation 01/23/24    Authorization Type [employer] HMO/PPO    Progress Note Due on Visit 20    PT Start Time 0932    PT Stop Time 1014    PT Time Calculation (min) 42 min    Activity Tolerance Patient tolerated treatment well    Behavior During Therapy WFL for tasks assessed/performed                Past Medical History:  Diagnosis Date   Arthritis    "minor" (10/20/2014)   Coronary artery disease    High cholesterol    Hypertension    Migraine    "visual" (10/20/2014)   Pneumonia ~ 1957   Past Surgical History:  Procedure Laterality Date   CARDIAC CATHETERIZATION  10/20/2014   COLONOSCOPY  09/25/2011   Procedure: COLONOSCOPY;  Surgeon: Suzette Espy, MD;  Location: [facility];  Service: Endoscopy;  Laterality: N/A;  12:15 pm   LEFT HEART CATHETERIZATION WITH CORONARY ANGIOGRAM N/A 10/20/2014   Procedure: LEFT HEART CATHETERIZATION WITH CORONARY ANGIOGRAM;  Surgeon: Odie Benne, MD;  Location: [facility] CATH LAB;  Service: Cardiovascular;  Laterality: N/A;   TONSILLECTOMY     TUBAL LIGATION     Patient Active Problem List   Diagnosis Date Noted   Osteopenia 12/04/2023   Urinary incontinence 12/04/2023   DOE (dyspnea on exertion) 10/21/2014   Abnormal cardiac function test 10/21/2014   Dyslipidemia-statin intol 10/21/2014   PAT (paroxysmal atrial tachycardia) (HCC) 10/21/2014   Coronary artery disease due to lipid rich plaque    Expressive aphasia post cath-felt to be secondary to mirgaine 10/20/2014   Essential hypertension 10/20/2014   Cerebral infarction [facility])     PCP: Artemisa Bile MD  REFERRING PROVIDER: Darrin Emerald, MD  REFERRING DIAG: M54.50 (ICD-10-CM) - Lumbar  pain   Rationale for Evaluation and Treatment: Rehabilitation  THERAPY DIAG:  Left-sided low back pain with left-sided sciatica, unspecified chronicity  ONSET DATE: over last 4 years  SUBJECTIVE:                                                                                                                                                                                           SUBJECTIVE STATEMENT: Pt states she feels 80% improved. Reports she went to the [facility] this morning and walked  over half a mile.  States she had gotten to the point she was walking with a cane at night but is no longer doing that. States her pain is now centralized and no longer having pains down her legs.   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 Yes: NPRS scale: 5/10 Pain location: in the L hip Pain description: aching and spreads across to the buttocks and down the back of the leg sometimes to the foot Aggravating factors: standing or sitting for long periods Relieving factors: resting and sometimes moving around, laying out in her bed  PRECAUTIONS: Fall  RED FLAGS: None   WEIGHT BEARING RESTRICTIONS: No  FALLS:  Has patient fallen in last 6 months? No  LIVING ENVIRONMENT: Lives with: lives with their family and on a farm Lives in: House/apartment Stairs: Yes: Internal: 1 steps; none and External: 0 steps; none Has following equipment at home: Single point cane  OCCUPATION: Retired   PLOF: Independent with basic ADLs and Independent with transfers  PATIENT GOALS: "Would like to get so it doesn't cripple me in the evenings. About suppertime I get to the point to where I can't move. I  get these episodes but they don't last and the next day I'm better. When I have to do a lot of bending and moving it gets me"  NEXT MD VISIT: TBD  OBJECTIVE:  Note: Objective measures were completed at Evaluation unless otherwise noted.  PATIENT SURVEYS:  Modified Oswestry 01/16/24:  15 points or 30 percent (was 18/50 - 36% at evaluation)   COGNITION: Overall cognitive status: Within functional limits for tasks assessed     SENSATION: WFL  MUSCLE LENGTH: Hamstrings: Right 35 deg; Left 40 deg  POSTURE: forward head and increased lumbar lordosis  PALPATION: L gluteal crest and hip flexor slight tenderness; paraspinal bilateral TTP slightly  LUMBAR ROM:   AROM eval 01/16/24  Flexion 85% WNL  Extension 70% WNL  Right lateral flexion Increased L pain in posterior hip - 75% Fingertip to knee  Left lateral flexion WFL Finger to knee  Right rotation    Left rotation     (Blank rows = not tested)  LOWER EXTREMITY ROM:     Passive  Right eval Left eval  Hip flexion    Hip extension    Hip abduction    Hip adduction    Hip internal rotation ~25 ~35  Hip external rotation ~65 ~55  Knee flexion    Knee extension    Ankle dorsiflexion    Ankle plantarflexion    Ankle inversion    Ankle eversion     (Blank rows = not tested)  LOWER EXTREMITY MMT:    MMT Right eval Left eval Right 01/16/24 Left 01/16/24  Hip flexion 4-/5 3+/5 4- 4-  Hip extension      Hip abduction 3+/5 3+/5 4- 4-  Hip adduction      Hip internal rotation      Hip external rotation      Knee flexion 4/5 3+/5 4 + 4  Knee extension 4/5 4-/5 4 4   Ankle dorsiflexion 4/5 4-/5 4 4   Ankle plantarflexion 4/5 4-/5 4 4  Ankle inversion      Ankle eversion       (Blank rows = not tested)  LUMBAR SPECIAL TESTS:  Straight leg raise test: Negative, Slump test: Positive, and FABER test: Negative  FUNCTIONAL TESTS:  5 times sit to stand: 01/16/24: 12.44 sec (was 23sec at evaluation) 30 seconds chair stand  test 6 reps without use of hands SLS: L: 7s  R: 23.61s  GAIT: Distance walked: nt Assistive device utilized: None Level of assistance: Complete Independence Comments: trendelenburg, decreased foot clearance, decreased thoracic rotation  TREATMENT DATE:  01/20/24 Standing:  hip abduction 2X10  Hip extension 2X10  Forward step up with power up 6" 2X10  Lateral step downs eccentric lowering 6" 2X10  Tandem stance 30" X 3 each LE lead  Lateral stepping GTB 3RT on 60ft line   01/16/24 Progress note:  5 times sit to stand: 01/16/24: 12.44 sec (was 23sec at evaluation) SLS: Lt: 18" Rt: 24" (was L: 7s  R: 23.61s) Modified Oswestry 01/16/24:  15 points or 30 percent (was 18/50 - 36% at evaluation)  MMT see above ROM see above  01/13/24 Standing:  6" forward step ups with power ups 2X10 each LE with 1 UE assist  KB carry 5# 20 foot line 3RT each UE carry  Lateral stepping GTB 20 foot line 3RT (120 foot total)  SLS with 2# theracane fwd lifts 10X each LE  SLS with 2# theracane rotations 10X each LE   PATIENT EDUCATION:  Education details: HEP, progressive mobility  Person educated: Patient Education method: Explanation and Demonstration Education comprehension: verbalized understanding  HOME EXERCISE PROGRAM: Access Code: [email or web address] URL: [email or web address] Date: 12/10/2023 Prepared by: Leonard Raker  Exercises - Supine Posterior Pelvic Tilt  - 1 x daily - 7 x weekly - 2 sets - 10 reps - 10s hold - Supine Lower Trunk Rotation  - 1 x daily - 7 x weekly - 2 sets - 10 reps - Supine Piriformis Stretch with Leg Straight  - 1 x daily - 7 x weekly - 2 sets - 4 reps - 15s hold - Clamshell  - 1 x daily - 7 x weekly - 3 sets - 10 reps - Seated Slump Nerve Glide  - 1 x daily - 7 x weekly - 3 sets - 10 reps -seated piriformis stretch 3x 30"  Date: 01/16/2024 Prepared by: Vickii Grand Exercises - Supine Figure 4 Piriformis Stretch  - 1 x daily - 7 x weekly - 1 sets - 6 reps  - 10s hold - Standing Lumbar Spine Flexion Stretch Counter  - 1 x daily - 7 x weekly - 3 sets - 10 reps - Side Stepping with Resistance at Ankles  - 1 x daily - 7 x weekly - 2 sets - 10 reps - Squat with Chair Touch  - 2 x daily - 7 x weekly - 1 sets - 10 reps - Seated Piriformis Stretch with Trunk Bend  - 2 x daily - 7 x weekly - 1 sets - 3 reps - 30" hold - Seated Table Hamstring Stretch  - 2 x daily - 7 x weekly - 1 sets - 3 reps - 30" hold - Standing Hip Abduction with Anterior Support  - 1 x daily - 7 x weekly - 2 sets - 10 reps - Standing Hip Extension with Counter Support  - 1 x daily - 7 x weekly - 2 sets - 10 reps - Tandem Stance with Support  - 1 x daily -  7 x weekly - 3 sets - 3 reps - 30 sec hold  ASSESSMENT:  CLINICAL IMPRESSION: Completed hip strengthening in standing today and added to HEP to replace easier activity.  Worked also on eccentric strengthening via lateral step downs with good control and no discomfort.  Introduced nustep to pt as she can complete this at the [facility] on days she cannot tolerate the treadmill.  Continued with challenges already initiated in therapy.  Pt able to maintain full 30" with tandem stance with either LE leading.  Pt continues to make good functional gains. Complete one more session and will be ready for discharge to HEP.   Patient is a 83 y.o. female who was seen today for physical therapy evaluation and treatment for referral of low back pain presenting with sxs consistent of lumbar dysfunction impacting L posterior buttock and L LE weakness as compared to R. Demonstrates decreased balance reactions in standing with transfers and gait changes requiring supervision as well as decreased functional core engagement during transfers and gait. Recommend continued skilled physical therapy for improving gait mechanics, functional lumbo pelvic activation and strength and for improving tolerance to transfers / routine ADL performance.   OBJECTIVE IMPAIRMENTS:  Abnormal gait, decreased activity tolerance, decreased balance, decreased endurance, decreased mobility, difficulty walking, decreased strength, impaired flexibility, improper body mechanics, postural dysfunction, and pain.   ACTIVITY LIMITATIONS: carrying, bending, sitting, standing, squatting, transfers, and functional activity at end of the day  PARTICIPATION LIMITATIONS: meal prep, cleaning, community activity, and yard work  PERSONAL FACTORS: Age and 1-2 comorbidities: CAD, HTN are also affecting patient's functional outcome.   REHAB POTENTIAL: Good  CLINICAL DECISION MAKING: Evolving/moderate complexity  EVALUATION COMPLEXITY: Moderate   GOALS: Goals reviewed with patient? Yes  SHORT TERM GOALS: Target date: 12/24/23  Pt will be independent with HEP with compliance for at least 75% of the week.  Baseline: prescribed HEP Goal status: MET  2.  Pt will be able to perform slump test with negative for increase in sxs down L LE to indicate improved neural mobility to progress with strengthening and mobility interventions.  Baseline: + slump test left Goal status: MET  LONG TERM GOALS: Target date: 01/24/24  Pt will be able to perform FTSTS in <18 s without UE use or increase in pain for demonstrating improved functional strength and endurance.  Baseline: see objective Goal status: MET  2.  Pt will score at <10% disability on ODI for indicating improved functional tolerance to ADL and improved QOL. Baseline:  Goal status: NOT MET   3.  Pt will report <1/10 pain at end of day for indicating improved functional postural and transfer mechanics appropriately transferring load and reducing load on lumbar spine.  Baseline: see objective Goal status: MET (depends on what she does during the day)   PLAN:  PT FREQUENCY: 2x/week  PT DURATION: 6 weeks  PLANNED INTERVENTIONS: 97110-Therapeutic exercises, 97530- Therapeutic activity, 97112- Neuromuscular re-education, 97535- Self  Care, 16109- Manual therapy, [phone]- Gait training, [phone]- Electrical stimulation (unattended), [phone]- Electrical stimulation (manual), Patient/Family education, Balance training, Stair training, Taping, Dry Needling, Joint mobilization, Joint manipulation, and Spinal mobilization.  PLAN FOR NEXT SESSION: Discharge next session per discussion; pt has returned to [facility].  Lorenso Romance, PTA/CLT [facility] Ph: [phone]  9:34 AM, 01/20/24

## 2024-01-22 ENCOUNTER — Encounter (HOSPITAL_COMMUNITY): Payer: Self-pay

## 2024-01-22 ENCOUNTER — Ambulatory Visit (HOSPITAL_COMMUNITY)

## 2024-01-22 DIAGNOSIS — M5442 Lumbago with sciatica, left side: Secondary | ICD-10-CM | POA: Diagnosis not present

## 2024-01-22 NOTE — Therapy (Signed)
 OUTPATIENT PHYSICAL THERAPY THORACOLUMBAR TREATMENT/DISCHARGE  PHYSICAL THERAPY DISCHARGE SUMMARY  Visits from Start of Care: 11  Current functional level related to goals / functional outcomes: Satisfied majority of goals.   Remaining deficits: None   Education / Equipment: Importance of HEP and consistent walking. Pt also educated on being cautious with walking on uneven ground and proper lifting mechanics.   Patient agrees to discharge. Patient goals were partially met. Patient is being discharged due to being pleased with the current functional level.  Patient Name: Anna Hicks MRN: [id number] DOB:04/15/1941, 83 y.o., female Today's Date: 01/22/2024  END OF SESSION:  PT End of Session - 01/22/24 1008     Visit Number 12    Number of Visits 12    Date for PT Re-Evaluation 01/23/24    Authorization Type [employer] HMO/PPO    Progress Note Due on Visit 12    PT Start Time 0930    PT Stop Time 1008    PT Time Calculation (min) 38 min    Activity Tolerance Patient tolerated treatment well    Behavior During Therapy WFL for tasks assessed/performed                 Past Medical History:  Diagnosis Date   Arthritis    "minor" (10/20/2014)   Coronary artery disease    High cholesterol    Hypertension    Migraine    "visual" (10/20/2014)   Pneumonia ~ 1957   Past Surgical History:  Procedure Laterality Date   CARDIAC CATHETERIZATION  10/20/2014   COLONOSCOPY  09/25/2011   Procedure: COLONOSCOPY;  Surgeon: Suzette Espy, MD;  Location: [facility];  Service: Endoscopy;  Laterality: N/A;  12:15 pm   LEFT HEART CATHETERIZATION WITH CORONARY ANGIOGRAM N/A 10/20/2014   Procedure: LEFT HEART CATHETERIZATION WITH CORONARY ANGIOGRAM;  Surgeon: Odie Benne, MD;  Location: [facility] CATH LAB;  Service: Cardiovascular;  Laterality: N/A;   TONSILLECTOMY     TUBAL LIGATION     Patient Active Problem List   Diagnosis Date Noted   Osteopenia 12/04/2023    Urinary incontinence 12/04/2023   DOE (dyspnea on exertion) 10/21/2014   Abnormal cardiac function test 10/21/2014   Dyslipidemia-statin intol 10/21/2014   PAT (paroxysmal atrial tachycardia) (HCC) 10/21/2014   Coronary artery disease due to lipid rich plaque    Expressive aphasia post cath-felt to be secondary to mirgaine 10/20/2014   Essential hypertension 10/20/2014   Cerebral infarction [facility])     PCP: Artemisa Bile MD  REFERRING PROVIDER: Darrin Emerald, MD  REFERRING DIAG: M54.50 (ICD-10-CM) - Lumbar pain   Rationale for Evaluation and Treatment: Rehabilitation  THERAPY DIAG:  Left-sided low back pain with left-sided sciatica, unspecified chronicity  ONSET DATE: over last 4 years  SUBJECTIVE:  SUBJECTIVE STATEMENT: Pt states she feels about 85% improved. She states she has been careful and has not been having to use the cane. Pt states she was a little sore after Mondays treatment. Pt states she has been walking back at the [facility] and that the pain is no longer going down her legs.    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 Yes: NPRS scale: 5/10 Pain location: in the L hip Pain description: aching and spreads across to the buttocks and down the back of the leg sometimes to the foot Aggravating factors: standing or sitting for long periods Relieving factors: resting and sometimes moving around, laying out in her bed  PRECAUTIONS: Fall  RED FLAGS: None   WEIGHT BEARING  RESTRICTIONS: No  FALLS:  Has patient fallen in last 6 months? No  LIVING ENVIRONMENT: Lives with: lives with their family and on a farm Lives in: House/apartment Stairs: Yes: Internal: 1 steps; none and External: 0 steps; none Has following equipment at home: Single point cane  OCCUPATION: Retired   PLOF: Independent with basic ADLs and Independent with transfers  PATIENT GOALS: "Would like to get so it doesn't cripple me in the evenings. About suppertime I get to the point to where I can't move. I get these episodes but they don't last and the next day I'm better. When I have to do a lot of bending and moving it gets me"  NEXT MD VISIT: TBD  OBJECTIVE:  Note: Objective measures were completed at Evaluation unless otherwise noted.  PATIENT SURVEYS:  Modified Oswestry 01/16/24:  15 points or 30 percent (was 18/50 - 36% at evaluation)   COGNITION: Overall cognitive status: Within functional limits for tasks assessed     SENSATION: WFL  MUSCLE LENGTH: Hamstrings: Right 35 deg; Left 40 deg  POSTURE: forward head and increased lumbar lordosis  PALPATION: L gluteal crest and hip flexor slight tenderness; paraspinal bilateral TTP slightly  LUMBAR ROM:   AROM eval 01/16/24  Flexion 85% WNL  Extension 70% WNL  Right lateral flexion Increased L pain in posterior hip - 75% Fingertip to knee  Left lateral flexion WFL Finger to knee  Right rotation    Left rotation     (Blank rows = not tested)  LOWER EXTREMITY ROM:     Passive  Right eval Left eval  Hip flexion    Hip extension    Hip abduction    Hip adduction    Hip internal rotation ~25 ~35  Hip external rotation ~65 ~55  Knee flexion    Knee extension    Ankle dorsiflexion    Ankle plantarflexion    Ankle inversion    Ankle eversion     (Blank rows = not tested)  LOWER EXTREMITY MMT:    MMT Right eval Left eval Right 01/16/24 Left 01/16/24  Hip flexion 4-/5 3+/5 4- 4-  Hip extension      Hip  abduction 3+/5 3+/5 4- 4-  Hip adduction      Hip internal rotation      Hip external rotation      Knee flexion 4/5 3+/5 4 + 4  Knee extension 4/5  4-/5 4 4   Ankle dorsiflexion 4/5 4-/5 4 4   Ankle plantarflexion 4/5 4-/5 4 4   Ankle inversion      Ankle eversion       (Blank rows = not tested)  LUMBAR SPECIAL TESTS:  Straight leg raise test: Negative, Slump test: Positive, and FABER test: Negative  FUNCTIONAL TESTS:  5 times sit to stand: 01/16/24: 12.44 sec (was 23sec at evaluation), 14.65 seconds 5/21 30 seconds chair stand test 6 reps without use of hands SLS: L: 7s  R: 23.61s  GAIT: Distance walked: nt Assistive device utilized: None Level of assistance: Complete Independence Comments: trendelenburg, decreased foot clearance, decreased thoracic rotation  TREATMENT DATE:  01/22/2024  Therapeutic Exercise: -Banded walks, lateral stepping/monster walks, 2 laps of 10 steps -Nustep, 3 minutes, 75 spm, level 4 resistance -Leg press, 2 sets of 10 reps, plate #3  Therapeutic Activity: -Sit to stands, 2 sets of 10 reps, pt cued for core activation -Lateral stepup and overs, 2 sets of 5 reps, 6 inch step  -Pt was educated on the importance of consistent exercise especially LE/core strengthening as well as aerobic exercise. Pt states she has all she needs as far as HEP.    01/20/24 Standing:  hip abduction 2X10  Hip extension 2X10  Forward step up with power up 6" 2X10  Lateral step downs eccentric lowering 6" 2X10  Tandem stance 30" X 3 each LE lead  Lateral stepping GTB 3RT on 39ft line   01/16/24 Progress note:  5 times sit to stand: 01/16/24: 12.44 sec (was 23sec at evaluation) SLS: Lt: 18" Rt: 24" (was L: 7s  R: 23.61s) Modified Oswestry 01/16/24:  15 points or 30 percent (was 18/50 - 36% at evaluation)  MMT see above ROM see above  01/13/24 Standing:  6" forward step ups with power ups 2X10 each LE with 1 UE assist  KB carry 5# 20 foot line 3RT each UE  carry  Lateral stepping GTB 20 foot line 3RT (120 foot total)  SLS with 2# theracane fwd lifts 10X each LE  SLS with 2# theracane rotations 10X each LE   PATIENT EDUCATION:  Education details: HEP, progressive mobility  Person educated: Patient Education method: Explanation and Demonstration Education comprehension: verbalized understanding  HOME EXERCISE PROGRAM: Access Code: [email or web address] URL: [email or web address] Date: 12/10/2023 Prepared by: Leonard Raker  Exercises - Supine Posterior Pelvic Tilt  - 1 x daily - 7 x weekly - 2 sets - 10 reps - 10s hold - Supine Lower Trunk Rotation  - 1 x daily - 7 x weekly - 2 sets - 10 reps - Supine Piriformis Stretch with Leg Straight  - 1 x daily - 7 x weekly - 2 sets - 4 reps - 15s hold - Clamshell  - 1 x daily - 7 x weekly - 3 sets - 10 reps - Seated Slump Nerve Glide  - 1 x daily - 7 x weekly - 3 sets - 10 reps -seated piriformis stretch 3x 30"  Date: 01/16/2024 Prepared by: Vickii Grand Exercises - Supine Figure 4 Piriformis Stretch  - 1 x daily - 7 x weekly - 1 sets - 6 reps - 10s hold - Standing Lumbar Spine Flexion Stretch Counter  - 1 x daily - 7 x weekly - 3 sets - 10 reps - Side Stepping with Resistance at Ankles  - 1 x daily - 7 x weekly - 2 sets - 10 reps - Squat with Chair Touch  - 2 x daily -  7 x weekly - 1 sets - 10 reps - Seated Piriformis Stretch with Trunk Bend  - 2 x daily - 7 x weekly - 1 sets - 3 reps - 30" hold - Seated Table Hamstring Stretch  - 2 x daily - 7 x weekly - 1 sets - 3 reps - 30" hold - Standing Hip Abduction with Anterior Support  - 1 x daily - 7 x weekly - 2 sets - 10 reps - Standing Hip Extension with Counter Support  - 1 x daily - 7 x weekly - 2 sets - 10 reps - Tandem Stance with Support  - 1 x daily - 7 x weekly - 3 sets - 3 reps - 30 sec hold  ASSESSMENT:  CLINICAL IMPRESSION: Completed hip strengthening in standing today and added to HEP to replace easier activity.  Worked also on  eccentric strengthening via lateral step downs with good control and no discomfort.  Introduced nustep to pt as she can complete this at the [facility] on days she cannot tolerate the treadmill.  Continued with challenges already initiated in therapy.  Pt able to maintain full 30" with tandem stance with either LE leading.  Pt continues to make good functional gains. Complete one more session and will be ready for discharge to HEP.   Patient is a 83 y.o. female who was seen today for physical therapy evaluation and treatment for referral of low back pain presenting with sxs consistent of lumbar dysfunction impacting L posterior buttock and L LE weakness as compared to R. Demonstrates decreased balance reactions in standing with transfers and gait changes requiring supervision as well as decreased functional core engagement during transfers and gait. Recommend continued skilled physical therapy for improving gait mechanics, functional lumbo pelvic activation and strength and for improving tolerance to transfers / routine ADL performance.   OBJECTIVE IMPAIRMENTS: Abnormal gait, decreased activity tolerance, decreased balance, decreased endurance, decreased mobility, difficulty walking, decreased strength, impaired flexibility, improper body mechanics, postural dysfunction, and pain.   ACTIVITY LIMITATIONS: carrying, bending, sitting, standing, squatting, transfers, and functional activity at end of the day  PARTICIPATION LIMITATIONS: meal prep, cleaning, community activity, and yard work  PERSONAL FACTORS: Age and 1-2 comorbidities: CAD, HTN are also affecting patient's functional outcome.   REHAB POTENTIAL: Good  CLINICAL DECISION MAKING: Evolving/moderate complexity  EVALUATION COMPLEXITY: Moderate   GOALS: Goals reviewed with patient? Yes  SHORT TERM GOALS: Target date: 12/24/23  Pt will be independent with HEP with compliance for at least 75% of the week.  Baseline: prescribed HEP Goal status:  MET  2.  Pt will be able to perform slump test with negative for increase in sxs down L LE to indicate improved neural mobility to progress with strengthening and mobility interventions.  Baseline: + slump test left Goal status: MET  LONG TERM GOALS: Target date: 01/24/24  Pt will be able to perform FTSTS in <18 s without UE use or increase in pain for demonstrating improved functional strength and endurance.  Baseline: see objective Goal status: MET  2.  Pt will score at <10% disability on ODI for indicating improved functional tolerance to ADL and improved QOL. Baseline:  Goal status: NOT MET   3.  Pt will report <1/10 pain at end of day for indicating improved functional postural and transfer mechanics appropriately transferring load and reducing load on lumbar spine.  Baseline: see objective Goal status: MET (depends on what she does during the day)   PLAN:  PT FREQUENCY: 2x/week  PT DURATION: 6 weeks  PLANNED INTERVENTIONS: 97110-Therapeutic exercises, 97530- Therapeutic activity, 97112- Neuromuscular re-education, [phone]- Self Care, 60454- Manual therapy, [phone]- Gait training, [phone]- Electrical stimulation (unattended), [phone]- Electrical stimulation (manual), Patient/Family education, Balance training, Stair training, Taping, Dry Needling, Joint mobilization, Joint manipulation, and Spinal mobilization.  PLAN FOR NEXT SESSION: Discharged  Armond Bertin, PT, DPT [facility] Office: [phone] 10:15 AM, 01/22/24
